# Patient Record
Sex: Male | Born: 1950 | Race: Black or African American | Hispanic: No | Marital: Married | Smoking: Former smoker
Health system: Southern US, Community
[De-identification: ages and names within clinical notes are randomized; demographics above are authoritative.]

## PROBLEM LIST (undated history)

## (undated) DIAGNOSIS — E119 Type 2 diabetes mellitus without complications: Secondary | ICD-10-CM

## (undated) DIAGNOSIS — K4090 Unilateral inguinal hernia, without obstruction or gangrene, not specified as recurrent: Secondary | ICD-10-CM

## (undated) DIAGNOSIS — K219 Gastro-esophageal reflux disease without esophagitis: Secondary | ICD-10-CM

## (undated) DIAGNOSIS — G629 Polyneuropathy, unspecified: Secondary | ICD-10-CM

## (undated) DIAGNOSIS — G4730 Sleep apnea, unspecified: Secondary | ICD-10-CM

## (undated) DIAGNOSIS — M1990 Unspecified osteoarthritis, unspecified site: Secondary | ICD-10-CM

## (undated) HISTORY — PX: SHOULDER ARTHROSCOPY: SHX128

## (undated) HISTORY — PX: SHOULDER SURGERY: SHX246

## (undated) HISTORY — PX: COLONOSCOPY: SHX174

## (undated) HISTORY — PX: EYE SURGERY: SHX253

## (undated) HISTORY — PX: KNEE ARTHROSCOPY: SUR90

---

## 2007-11-25 ENCOUNTER — Emergency Department (HOSPITAL_COMMUNITY): Admission: EM | Admit: 2007-11-25 | Discharge: 2007-11-25 | Payer: Self-pay | Admitting: Emergency Medicine

## 2015-06-24 ENCOUNTER — Other Ambulatory Visit: Payer: Self-pay | Admitting: Orthopedic Surgery

## 2015-08-02 ENCOUNTER — Encounter (HOSPITAL_COMMUNITY)
Admission: RE | Admit: 2015-08-02 | Discharge: 2015-08-02 | Disposition: A | Discharge status: Home / Self Care | Source: Ambulatory Visit | Attending: Orthopedic Surgery | Admitting: Orthopedic Surgery

## 2015-08-02 ENCOUNTER — Encounter (HOSPITAL_COMMUNITY): Payer: Self-pay

## 2015-08-02 DIAGNOSIS — G4730 Sleep apnea, unspecified: Secondary | ICD-10-CM | POA: Diagnosis not present

## 2015-08-02 DIAGNOSIS — Z01812 Encounter for preprocedural laboratory examination: Secondary | ICD-10-CM | POA: Insufficient documentation

## 2015-08-02 DIAGNOSIS — Z01818 Encounter for other preprocedural examination: Secondary | ICD-10-CM | POA: Diagnosis not present

## 2015-08-02 DIAGNOSIS — M1711 Unilateral primary osteoarthritis, right knee: Secondary | ICD-10-CM | POA: Diagnosis not present

## 2015-08-02 HISTORY — DX: Sleep apnea, unspecified: G47.30

## 2015-08-02 HISTORY — DX: Unspecified osteoarthritis, unspecified site: M19.90

## 2015-08-02 HISTORY — DX: Gastro-esophageal reflux disease without esophagitis: K21.9

## 2015-08-02 LAB — SURGICAL PCR SCREEN
MRSA, PCR: NEGATIVE
Staphylococcus aureus: NEGATIVE

## 2015-08-02 LAB — COMPREHENSIVE METABOLIC PANEL
ALT: 20 U/L (ref 17–63)
AST: 19 U/L (ref 15–41)
Albumin: 4 g/dL (ref 3.5–5.0)
Alkaline Phosphatase: 63 U/L (ref 38–126)
Anion gap: 9 (ref 5–15)
BUN: 10 mg/dL (ref 6–20)
CO2: 24 mmol/L (ref 22–32)
Calcium: 9.7 mg/dL (ref 8.9–10.3)
Chloride: 105 mmol/L (ref 101–111)
Creatinine, Ser: 0.85 mg/dL (ref 0.61–1.24)
GFR calc Af Amer: >60 mL/min (ref >60)
GFR calc non Af Amer: >60 mL/min (ref >60)
Glucose, Bld: 105 mg/dL — ABNORMAL HIGH (ref 65–99)
Potassium: 4.2 mmol/L (ref 3.5–5.1)
Sodium: 138 mmol/L (ref 135–145)
Total Bilirubin: 0.5 mg/dL (ref 0.3–1.2)
Total Protein: 8.3 g/dL — ABNORMAL HIGH (ref 6.5–8.1)

## 2015-08-02 LAB — PROTIME-INR
INR: 1.16 (ref 0.00–1.49)
Prothrombin Time: 15 seconds (ref 11.6–15.2)

## 2015-08-02 LAB — CBC WITH DIFFERENTIAL/PLATELET
Basophils Absolute: 0 10*3/uL (ref 0.0–0.1)
Basophils Relative: 0 %
Eosinophils Absolute: 0.1 10*3/uL (ref 0.0–0.7)
Eosinophils Relative: 2 %
HCT: 46.3 % (ref 39.0–52.0)
Hemoglobin: 15.1 g/dL (ref 13.0–17.0)
Lymphocytes Relative: 39 %
Lymphs Abs: 2.5 10*3/uL (ref 0.7–4.0)
MCH: 27.3 pg (ref 26.0–34.0)
MCHC: 32.6 g/dL (ref 30.0–36.0)
MCV: 83.7 fL (ref 78.0–100.0)
Monocytes Absolute: 0.5 10*3/uL (ref 0.1–1.0)
Monocytes Relative: 7 %
Neutro Abs: 3.2 10*3/uL (ref 1.7–7.7)
Neutrophils Relative %: 52 %
Platelets: 192 10*3/uL (ref 150–400)
RBC: 5.53 MIL/uL (ref 4.22–5.81)
RDW: 14 % (ref 11.5–15.5)
WBC: 6.4 10*3/uL (ref 4.0–10.5)

## 2015-08-02 LAB — URINALYSIS, ROUTINE W REFLEX MICROSCOPIC
Bilirubin Urine: NEGATIVE
Glucose, UA: NEGATIVE mg/dL
Hgb urine dipstick: NEGATIVE
Ketones, ur: NEGATIVE mg/dL
Leukocytes, UA: NEGATIVE
Nitrite: NEGATIVE
Protein, ur: NEGATIVE mg/dL
Specific Gravity, Urine: 1.018 (ref 1.005–1.030)
Urobilinogen, UA: 0.2 mg/dL (ref 0.0–1.0)
pH: 5 (ref 5.0–8.0)

## 2015-08-02 LAB — APTT: aPTT: 31 seconds (ref 24–37)

## 2015-08-02 NOTE — Pre-Procedure Instructions (Signed)
Anthony Nash  08/02/2015     Your procedure is scheduled on : Monday August 12, 2015 at 7:30 AM.  Report to [facility] Admitting at 5:30 A.M.  Call this number if you have problems the morning of surgery: [phone]    Remember:  Do not eat food or drink liquids after midnight.  Take these medicines the morning of surgery with A SIP OF WATER : Acetaminophen (Tylenol) if needed, Omeprazole (Prilosec)   Stop taking any vitamins, herbal medications, Advil, Motrin, Ibuprofen, BC Powder, etc on Monday October 10th   Please bring CPAP mask DOS   Do not wear jewelry.  Do not wear lotions, powders, or cologne.    Men may shave face and neck.  Do not bring valuables to the hospital.  [facility] is not responsible for any belongings or valuables.  Contacts, dentures or bridgework may not be worn into surgery.  Leave your suitcase in the car.  After surgery it may be brought to your room.  For patients admitted to the hospital, discharge time will be determined by your treatment team.  Patients discharged the day of surgery will not be allowed to drive home.   Name and phone number of your driver:    Special instructions:  Shower using CHG soap the night before and the morning of your surgery  Please read over the following fact sheets that you were given. Pain Booklet, Coughing and Deep Breathing, Total Joint Packet, MRSA Information and Surgical Site Infection Prevention

## 2015-08-04 LAB — URINE CULTURE

## 2015-08-09 MED ORDER — BUPIVACAINE LIPOSOME 1.3 % IJ SUSP
20.0000 mL | Freq: Once | INTRAMUSCULAR | Status: AC
  Administered 2015-08-12: 20 mL
  Filled 2015-08-09: qty 20

## 2015-08-09 MED ORDER — SODIUM CHLORIDE 0.9 % IV SOLN
1000.0000 mg | INTRAVENOUS | Status: AC
  Administered 2015-08-12: 1000 mg via INTRAVENOUS
  Filled 2015-08-09: qty 10

## 2015-08-11 MED ORDER — SODIUM CHLORIDE 0.9 % IV SOLN: INTRAVENOUS | Status: DC

## 2015-08-11 MED ORDER — CEFAZOLIN SODIUM-DEXTROSE 2-3 GM-% IV SOLR
2.0000 g | INTRAVENOUS | Status: AC
  Administered 2015-08-12: 2 g via INTRAVENOUS
  Filled 2015-08-11: qty 50

## 2015-08-12 ENCOUNTER — Inpatient Hospital Stay (HOSPITAL_COMMUNITY)
Admission: RE | Admit: 2015-08-12 | Discharge: 2015-08-13 | DRG: 470 | Disposition: A | Discharge status: Home Health | Source: Ambulatory Visit | Attending: Orthopedic Surgery | Admitting: Orthopedic Surgery

## 2015-08-12 ENCOUNTER — Encounter (HOSPITAL_COMMUNITY): Payer: Self-pay | Admitting: Surgery

## 2015-08-12 ENCOUNTER — Inpatient Hospital Stay (HOSPITAL_COMMUNITY): Admitting: Certified Registered Nurse Anesthetist

## 2015-08-12 ENCOUNTER — Encounter (HOSPITAL_COMMUNITY)
Admission: RE | Disposition: A | Discharge status: Home Health | Payer: Self-pay | Source: Ambulatory Visit | Attending: Orthopedic Surgery

## 2015-08-12 DIAGNOSIS — G4730 Sleep apnea, unspecified: Secondary | ICD-10-CM | POA: Diagnosis present

## 2015-08-12 DIAGNOSIS — Z87891 Personal history of nicotine dependence: Secondary | ICD-10-CM

## 2015-08-12 DIAGNOSIS — D62 Acute posthemorrhagic anemia: Secondary | ICD-10-CM | POA: Diagnosis not present

## 2015-08-12 DIAGNOSIS — M1711 Unilateral primary osteoarthritis, right knee: Principal | ICD-10-CM | POA: Diagnosis present

## 2015-08-12 DIAGNOSIS — K219 Gastro-esophageal reflux disease without esophagitis: Secondary | ICD-10-CM | POA: Diagnosis present

## 2015-08-12 DIAGNOSIS — M25561 Pain in right knee: Secondary | ICD-10-CM | POA: Diagnosis present

## 2015-08-12 DIAGNOSIS — Z96659 Presence of unspecified artificial knee joint: Secondary | ICD-10-CM

## 2015-08-12 HISTORY — PX: TOTAL KNEE ARTHROPLASTY: SHX125

## 2015-08-12 LAB — CBC
HCT: 43.2 % (ref 39.0–52.0)
Hemoglobin: 14 g/dL (ref 13.0–17.0)
MCH: 27.2 pg (ref 26.0–34.0)
MCHC: 32.4 g/dL (ref 30.0–36.0)
MCV: 84 fL (ref 78.0–100.0)
Platelets: 178 10*3/uL (ref 150–400)
RBC: 5.14 MIL/uL (ref 4.22–5.81)
RDW: 14.1 % (ref 11.5–15.5)
WBC: 7.7 10*3/uL (ref 4.0–10.5)

## 2015-08-12 LAB — CREATININE, SERUM
Creatinine, Ser: 0.99 mg/dL (ref 0.61–1.24)
GFR calc Af Amer: >60 mL/min (ref >60)
GFR calc non Af Amer: >60 mL/min (ref >60)

## 2015-08-12 SURGERY — ARTHROPLASTY, KNEE, TOTAL
Anesthesia: Monitor Anesthesia Care | Site: Knee | Laterality: Right

## 2015-08-12 MED ORDER — METOCLOPRAMIDE HCL 5 MG/ML IJ SOLN: 5.0000 mg | Freq: Three times a day (TID) | INTRAMUSCULAR | Status: DC | PRN

## 2015-08-12 MED ORDER — DIPHENHYDRAMINE HCL 12.5 MG/5ML PO ELIX
12.5000 mg | ORAL_SOLUTION | ORAL | Status: DC | PRN
  Administered 2015-08-13: 25 mg via ORAL
  Filled 2015-08-12: qty 10

## 2015-08-12 MED ORDER — FENTANYL CITRATE (PF) 100 MCG/2ML IJ SOLN
INTRAMUSCULAR | Status: DC | PRN
  Administered 2015-08-12: 100 ug via INTRAVENOUS
  Administered 2015-08-12 (×3): 50 ug via INTRAVENOUS

## 2015-08-12 MED ORDER — BISACODYL 5 MG PO TBEC: 5.0000 mg | DELAYED_RELEASE_TABLET | Freq: Every day | ORAL | Status: DC | PRN

## 2015-08-12 MED ORDER — BUPIVACAINE-EPINEPHRINE (PF) 0.5% -1:200000 IJ SOLN
INTRAMUSCULAR | Status: AC
  Filled 2015-08-12: qty 30

## 2015-08-12 MED ORDER — SODIUM CHLORIDE 0.9 % IR SOLN
Status: DC | PRN
  Administered 2015-08-12: 1000 mL

## 2015-08-12 MED ORDER — METHOCARBAMOL 1000 MG/10ML IJ SOLN
500.0000 mg | Freq: Four times a day (QID) | INTRAVENOUS | Status: DC | PRN
  Administered 2015-08-12: 500 mg via INTRAVENOUS
  Filled 2015-08-12 (×2): qty 5

## 2015-08-12 MED ORDER — CELECOXIB 200 MG PO CAPS
200.0000 mg | ORAL_CAPSULE | Freq: Two times a day (BID) | ORAL | Status: DC
  Administered 2015-08-12 – 2015-08-13 (×3): 200 mg via ORAL
  Filled 2015-08-12 (×3): qty 1

## 2015-08-12 MED ORDER — HYDROMORPHONE HCL 1 MG/ML IJ SOLN
INTRAMUSCULAR | Status: AC
  Filled 2015-08-12: qty 1

## 2015-08-12 MED ORDER — SODIUM CHLORIDE 0.9 % IV SOLN
INTRAVENOUS | Status: DC
  Administered 2015-08-12: 21:00:00 via INTRAVENOUS

## 2015-08-12 MED ORDER — DOCUSATE SODIUM 100 MG PO CAPS
100.0000 mg | ORAL_CAPSULE | Freq: Two times a day (BID) | ORAL | Status: DC
  Administered 2015-08-12 – 2015-08-13 (×3): 100 mg via ORAL
  Filled 2015-08-12 (×3): qty 1

## 2015-08-12 MED ORDER — MIDAZOLAM HCL 5 MG/5ML IJ SOLN
INTRAMUSCULAR | Status: DC | PRN
  Administered 2015-08-12 (×2): 1 mg via INTRAVENOUS

## 2015-08-12 MED ORDER — LIDOCAINE HCL (CARDIAC) 20 MG/ML IV SOLN
INTRAVENOUS | Status: DC | PRN
  Administered 2015-08-12: 80 mg via INTRAVENOUS

## 2015-08-12 MED ORDER — ENOXAPARIN SODIUM 30 MG/0.3ML ~~LOC~~ SOLN
30.0000 mg | Freq: Two times a day (BID) | SUBCUTANEOUS | Status: DC
  Administered 2015-08-13: 30 mg via SUBCUTANEOUS
  Filled 2015-08-12: qty 0.3

## 2015-08-12 MED ORDER — ZOLPIDEM TARTRATE 5 MG PO TABS: 5.0000 mg | ORAL_TABLET | Freq: Every evening | ORAL | Status: DC | PRN

## 2015-08-12 MED ORDER — DEXAMETHASONE SODIUM PHOSPHATE 4 MG/ML IJ SOLN
INTRAMUSCULAR | Status: AC
  Filled 2015-08-12: qty 1

## 2015-08-12 MED ORDER — SUCCINYLCHOLINE CHLORIDE 20 MG/ML IJ SOLN
INTRAMUSCULAR | Status: AC
  Filled 2015-08-12: qty 1

## 2015-08-12 MED ORDER — ACETAMINOPHEN 325 MG PO TABS: 650.0000 mg | ORAL_TABLET | Freq: Four times a day (QID) | ORAL | Status: DC | PRN

## 2015-08-12 MED ORDER — ONDANSETRON HCL 4 MG/2ML IJ SOLN: 4.0000 mg | Freq: Four times a day (QID) | INTRAMUSCULAR | Status: DC | PRN

## 2015-08-12 MED ORDER — BUPIVACAINE-EPINEPHRINE 0.5% -1:200000 IJ SOLN
INTRAMUSCULAR | Status: DC | PRN
  Administered 2015-08-12: 60 mL

## 2015-08-12 MED ORDER — PROPOFOL 10 MG/ML IV BOLUS
INTRAVENOUS | Status: DC | PRN
  Administered 2015-08-12: 150 mg via INTRAVENOUS

## 2015-08-12 MED ORDER — PHENOL 1.4 % MT LIQD: 1.0000 | OROMUCOSAL | Status: DC | PRN

## 2015-08-12 MED ORDER — LACTATED RINGERS IV SOLN
INTRAVENOUS | Status: DC | PRN
  Administered 2015-08-12 (×2): via INTRAVENOUS

## 2015-08-12 MED ORDER — OXYCODONE HCL 5 MG PO TABS
5.0000 mg | ORAL_TABLET | ORAL | Status: DC | PRN
  Administered 2015-08-12 – 2015-08-13 (×2): 10 mg via ORAL
  Administered 2015-08-13: 5 mg via ORAL
  Administered 2015-08-13 (×2): 10 mg via ORAL
  Filled 2015-08-12 (×2): qty 2
  Filled 2015-08-12: qty 1
  Filled 2015-08-12 (×2): qty 2

## 2015-08-12 MED ORDER — METOCLOPRAMIDE HCL 5 MG PO TABS: 5.0000 mg | ORAL_TABLET | Freq: Three times a day (TID) | ORAL | Status: DC | PRN

## 2015-08-12 MED ORDER — ONDANSETRON HCL 4 MG/2ML IJ SOLN
INTRAMUSCULAR | Status: DC | PRN
  Administered 2015-08-12: 4 mg via INTRAVENOUS

## 2015-08-12 MED ORDER — ALUM & MAG HYDROXIDE-SIMETH 200-200-20 MG/5ML PO SUSP: 30.0000 mL | ORAL | Status: DC | PRN

## 2015-08-12 MED ORDER — DEXAMETHASONE SODIUM PHOSPHATE 4 MG/ML IJ SOLN
INTRAMUSCULAR | Status: DC | PRN
  Administered 2015-08-12: 4 mg via INTRAVENOUS

## 2015-08-12 MED ORDER — MIDAZOLAM HCL 2 MG/2ML IJ SOLN
INTRAMUSCULAR | Status: AC
  Filled 2015-08-12: qty 4

## 2015-08-12 MED ORDER — CHLORHEXIDINE GLUCONATE 4 % EX LIQD: 60.0000 mL | Freq: Once | CUTANEOUS | Status: DC

## 2015-08-12 MED ORDER — ONDANSETRON HCL 4 MG PO TABS
4.0000 mg | ORAL_TABLET | Freq: Four times a day (QID) | ORAL | Status: DC | PRN
  Administered 2015-08-13: 4 mg via ORAL
  Filled 2015-08-12: qty 1

## 2015-08-12 MED ORDER — ACETAMINOPHEN 650 MG RE SUPP: 650.0000 mg | Freq: Four times a day (QID) | RECTAL | Status: DC | PRN

## 2015-08-12 MED ORDER — PANTOPRAZOLE SODIUM 40 MG PO TBEC
40.0000 mg | DELAYED_RELEASE_TABLET | Freq: Every day | ORAL | Status: DC
  Administered 2015-08-12 – 2015-08-13 (×2): 40 mg via ORAL
  Filled 2015-08-12 (×2): qty 1

## 2015-08-12 MED ORDER — METHOCARBAMOL 500 MG PO TABS
500.0000 mg | ORAL_TABLET | Freq: Four times a day (QID) | ORAL | Status: DC | PRN
  Administered 2015-08-12: 500 mg via ORAL
  Filled 2015-08-12 (×4): qty 1

## 2015-08-12 MED ORDER — HYDROMORPHONE HCL 1 MG/ML IJ SOLN
1.0000 mg | INTRAMUSCULAR | Status: DC | PRN
Start: 2015-08-12 — End: 2015-08-13
  Administered 2015-08-12 (×2): 1 mg via INTRAVENOUS
  Filled 2015-08-12 (×2): qty 1

## 2015-08-12 MED ORDER — BUPIVACAINE-EPINEPHRINE (PF) 0.25% -1:200000 IJ SOLN
INTRAMUSCULAR | Status: AC
  Filled 2015-08-12: qty 30

## 2015-08-12 MED ORDER — OXYCODONE HCL ER 10 MG PO T12A
10.0000 mg | EXTENDED_RELEASE_TABLET | Freq: Two times a day (BID) | ORAL | Status: DC
  Administered 2015-08-12 – 2015-08-13 (×3): 10 mg via ORAL
  Filled 2015-08-12 (×3): qty 1

## 2015-08-12 MED ORDER — HYDROMORPHONE HCL 1 MG/ML IJ SOLN
0.2500 mg | INTRAMUSCULAR | Status: DC | PRN
  Administered 2015-08-12: 0.5 mg via INTRAVENOUS

## 2015-08-12 MED ORDER — CEFAZOLIN SODIUM-DEXTROSE 2-3 GM-% IV SOLR
2.0000 g | Freq: Four times a day (QID) | INTRAVENOUS | Status: AC
  Administered 2015-08-12 (×2): 2 g via INTRAVENOUS
  Filled 2015-08-12 (×2): qty 50

## 2015-08-12 MED ORDER — PROPOFOL 10 MG/ML IV BOLUS
INTRAVENOUS | Status: AC
  Filled 2015-08-12: qty 20

## 2015-08-12 MED ORDER — SENNOSIDES-DOCUSATE SODIUM 8.6-50 MG PO TABS: 1.0000 | ORAL_TABLET | Freq: Every evening | ORAL | Status: DC | PRN

## 2015-08-12 MED ORDER — MENTHOL 3 MG MT LOZG: 1.0000 | LOZENGE | OROMUCOSAL | Status: DC | PRN

## 2015-08-12 MED ORDER — FLEET ENEMA 7-19 GM/118ML RE ENEM: 1.0000 | ENEMA | Freq: Once | RECTAL | Status: DC | PRN

## 2015-08-12 MED ORDER — FENTANYL CITRATE (PF) 250 MCG/5ML IJ SOLN
INTRAMUSCULAR | Status: AC
  Filled 2015-08-12: qty 5

## 2015-08-12 SURGICAL SUPPLY — 56 items
BANDAGE ESMARK 6X9 LF (GAUZE/BANDAGES/DRESSINGS) ×1 IMPLANT
BLADE SAGITTAL 13X1.27X60 (BLADE) ×2 IMPLANT
BLADE SAW SGTL 83.5X18.5 (BLADE) ×2 IMPLANT
BLADE SURG 10 STRL SS (BLADE) ×2 IMPLANT
BNDG ESMARK 6X9 LF (GAUZE/BANDAGES/DRESSINGS) ×2
BOWL SMART MIX CTS (DISPOSABLE) ×4 IMPLANT
CAPT KNEE TOTAL 3 ×2 IMPLANT
CEMENT BONE SIMPLEX SPEEDSET (Cement) ×4 IMPLANT
COVER SURGICAL LIGHT HANDLE (MISCELLANEOUS) ×2 IMPLANT
CUFF TOURNIQUET SINGLE 34IN LL (TOURNIQUET CUFF) ×2 IMPLANT
DRAPE EXTREMITY T 121X128X90 (DRAPE) ×2 IMPLANT
DRAPE INCISE IOBAN 66X45 STRL (DRAPES) ×4 IMPLANT
DRAPE PROXIMA HALF (DRAPES) IMPLANT
DRAPE U-SHAPE 47X51 STRL (DRAPES) ×2 IMPLANT
DRSG ADAPTIC 3X8 NADH LF (GAUZE/BANDAGES/DRESSINGS) ×2 IMPLANT
DRSG PAD ABDOMINAL 8X10 ST (GAUZE/BANDAGES/DRESSINGS) ×2 IMPLANT
DURAPREP 26ML APPLICATOR (WOUND CARE) ×4 IMPLANT
ELECT REM PT RETURN 9FT ADLT (ELECTROSURGICAL) ×2
ELECTRODE REM PT RTRN 9FT ADLT (ELECTROSURGICAL) ×1 IMPLANT
GAUZE SPONGE 4X4 12PLY STRL (GAUZE/BANDAGES/DRESSINGS) ×2 IMPLANT
GLOVE BIOGEL M 7.0 STRL (GLOVE) IMPLANT
GLOVE BIOGEL PI IND STRL 7.5 (GLOVE) IMPLANT
GLOVE BIOGEL PI IND STRL 8.5 (GLOVE) ×2 IMPLANT
GLOVE BIOGEL PI INDICATOR 7.5 (GLOVE)
GLOVE BIOGEL PI INDICATOR 8.5 (GLOVE) ×2
GLOVE SURG ORTHO 8.0 STRL STRW (GLOVE) ×4 IMPLANT
GOWN STRL REUS W/ TWL LRG LVL3 (GOWN DISPOSABLE) ×1 IMPLANT
GOWN STRL REUS W/ TWL XL LVL3 (GOWN DISPOSABLE) ×2 IMPLANT
GOWN STRL REUS W/TWL LRG LVL3 (GOWN DISPOSABLE) ×1
GOWN STRL REUS W/TWL XL LVL3 (GOWN DISPOSABLE) ×2
HANDPIECE INTERPULSE COAX TIP (DISPOSABLE) ×1
HOOD PEEL AWAY FACE SHEILD DIS (HOOD) ×6 IMPLANT
KIT BASIN OR (CUSTOM PROCEDURE TRAY) ×2 IMPLANT
KIT ROOM TURNOVER OR (KITS) ×2 IMPLANT
KNEE CAPITATED TOTAL 3 ×1 IMPLANT
MANIFOLD NEPTUNE II (INSTRUMENTS) ×2 IMPLANT
NEEDLE 22X1 1/2 (OR ONLY) (NEEDLE) ×4 IMPLANT
NS IRRIG 1000ML POUR BTL (IV SOLUTION) ×2 IMPLANT
PACK TOTAL JOINT (CUSTOM PROCEDURE TRAY) ×2 IMPLANT
PACK UNIVERSAL I (CUSTOM PROCEDURE TRAY) ×2 IMPLANT
PAD ARMBOARD 7.5X6 YLW CONV (MISCELLANEOUS) ×4 IMPLANT
PADDING CAST COTTON 6X4 STRL (CAST SUPPLIES) ×2 IMPLANT
SET HNDPC FAN SPRY TIP SCT (DISPOSABLE) ×1 IMPLANT
SPONGE GAUZE 4X4 12PLY STER LF (GAUZE/BANDAGES/DRESSINGS) ×2 IMPLANT
STAPLER VISISTAT 35W (STAPLE) ×2 IMPLANT
SUCTION FRAZIER TIP 10 FR DISP (SUCTIONS) ×2 IMPLANT
SUT BONE WAX W31G (SUTURE) ×2 IMPLANT
SUT VIC AB 0 CTB1 27 (SUTURE) ×4 IMPLANT
SUT VIC AB 1 CT1 27 (SUTURE) ×2
SUT VIC AB 1 CT1 27XBRD ANBCTR (SUTURE) ×2 IMPLANT
SUT VIC AB 2-0 CT1 27 (SUTURE) ×2
SUT VIC AB 2-0 CT1 TAPERPNT 27 (SUTURE) ×2 IMPLANT
SYR 20CC LL (SYRINGE) ×4 IMPLANT
TOWEL OR 17X24 6PK STRL BLUE (TOWEL DISPOSABLE) ×2 IMPLANT
TOWEL OR 17X26 10 PK STRL BLUE (TOWEL DISPOSABLE) ×2 IMPLANT
WATER STERILE IRR 1000ML POUR (IV SOLUTION) ×4 IMPLANT

## 2015-08-12 NOTE — Evaluation (Signed)
Physical Therapy Evaluation Patient Details Name: Anthony Nash MRN: [id number] DOB: [birth year] Today's Date: 08/12/2015   History of Present Illness  64 y.o. male s/p Rt total knee arthroplasty.  Clinical Impression  Pt is s/p Rt TKA presenting with the deficits listed below (see PT Problem List). Demonstrates good RLE strength with minimal knee instability, tolerating up to 50 feet of ambulation post op day #0. Reviewed precautions, exercising and appropriate positioning for optimal healing. Anticipate quick achievement of functional goals in hosptial. Pt will benefit from skilled PT to increase their independence and safety with mobility to allow discharge to the venue listed below.      Follow Up Recommendations Home health PT;Supervision for mobility/OOB    Equipment Recommendations  None recommended by PT    Recommendations for Other Services OT consult     Precautions / Restrictions Precautions Precautions: Knee Precaution Booklet Issued: Yes (comment) Precaution Comments: reviewed handout Restrictions Weight Bearing Restrictions: Yes RLE Weight Bearing: Weight bearing as tolerated      Mobility  Bed Mobility Overal bed mobility: Needs Assistance Bed Mobility: Supine to Sit     Supine to sit: Supervision     General bed mobility comments: Supervision for safety. VC for technique. Good strength through RLE to control with transition.  Transfers Overall transfer level: Needs assistance Equipment used: Rolling walker (2 wheeled) Transfers: Sit to/from Stand Sit to Stand: Min assist         General transfer comment: Min assist for boost to stand. VC for hand placement. Minimal weight bearing through RLE to rise.  Ambulation/Gait Ambulation/Gait assistance: Min guard Ambulation Distance (Feet): 50 Feet Assistive device: Rolling walker (2 wheeled) Gait Pattern/deviations: Step-to pattern;Step-through pattern;Decreased step length - left;Decreased stance time  - right;Antalgic Gait velocity: decreased Gait velocity interpretation: Below normal speed for age/gender General Gait Details: Educated on safe DME use with a rolling walker. VC for sequencing and technique. Close guard for safety. Progressed to step through gait pattern. No buckling noted although mild instability in Rt knee present.  Stairs            Wheelchair Mobility    Modified Rankin (Stroke Patients Only)       Balance Overall balance assessment: Needs assistance Sitting-balance support: No upper extremity supported;Feet supported Sitting balance-Leahy Scale: Good     Standing balance support: Single extremity supported Standing balance-Leahy Scale: Poor                               Pertinent Vitals/Pain Pain Assessment: 0-10 Pain Score: 2  Pain Location: Rt knee Pain Descriptors / Indicators: Aching Pain Intervention(s): Monitored during session;Repositioned    Home Living Family/patient expects to be discharged to:: Private residence Living Arrangements: Spouse/significant other Available Help at Discharge: Family;Available 24 hours/day Type of Home: House Home Access: Stairs to enter Entrance Stairs-Rails: Psychiatric nurse of Steps: 4 Home Layout: Two level;Able to live on main level with bedroom/bathroom Home Equipment: Gilford Rile - 2 wheels;Bedside commode;Crutches      Prior Function Level of Independence: Independent with assistive device(s)         Comments: occasionally used crutches     Hand Dominance   Dominant Hand:  (both)    Extremity/Trunk Assessment   Upper Extremity Assessment: Defer to OT evaluation           Lower Extremity Assessment: RLE deficits/detail RLE Deficits / Details: decreased strength and ROM as expected post op.  Communication   Communication: No difficulties  Cognition Arousal/Alertness: Awake/alert Behavior During Therapy: WFL for tasks assessed/performed Overall  Cognitive Status: Within Functional Limits for tasks assessed                      General Comments General comments (skin integrity, edema, etc.): Reviewed knee precautions and positioning for optimal healing.    Exercises Total Joint Exercises Ankle Circles/Pumps: AROM;Both;10 reps;Seated Quad Sets: Strengthening;Both;10 reps;Supine Long Arc Quad: Strengthening;Right;10 reps;Seated Knee Flexion: AAROM;Right;10 reps;Seated      Assessment/Plan    PT Assessment Patient needs continued PT services  PT Diagnosis Difficulty walking;Abnormality of gait;Acute pain   PT Problem List Decreased strength;Decreased range of motion;Decreased activity tolerance;Decreased balance;Decreased mobility;Decreased knowledge of use of DME;Decreased knowledge of precautions;Pain  PT Treatment Interventions DME instruction;Gait training;Stair training;Functional mobility training;Therapeutic activities;Therapeutic exercise;Balance training;Neuromuscular re-education;Patient/family education;Modalities   PT Goals (Current goals can be found in the Care Plan section) Acute Rehab PT Goals Patient Stated Goal: No pain when walking PT Goal Formulation: With patient Time For Goal Achievement: 08/26/15 Potential to Achieve Goals: Good    Frequency 7X/week   Barriers to discharge        Co-evaluation               End of Session Equipment Utilized During Treatment: Gait belt Activity Tolerance: Patient tolerated treatment well Patient left: in chair;with call bell/phone within reach;with SCD's reapplied Nurse Communication: Mobility status         Time: 6378-5885 PT Time Calculation (min) (ACUTE ONLY): 38 min   Charges:   PT Evaluation $Initial PT Evaluation Tier I: 1 Procedure PT Treatments $Gait Training: 8-22 mins $Therapeutic Activity: 8-22 mins   PT G CodesEllouise Newer 08/12/2015, 3:09 PM Elayne Snare, [place]

## 2015-08-12 NOTE — Anesthesia Preprocedure Evaluation (Addendum)
Anesthesia Evaluation  Patient identified by MRN, date of birth, ID band Patient awake    Reviewed: Allergy & Precautions, H&P , NPO status , Patient's Chart, lab work & pertinent test results  Airway Mallampati: II  TM Distance: >3 FB Neck ROM: Full    Dental no notable dental hx. (+) Teeth Intact, Dental Advisory Given   Pulmonary sleep apnea and Continuous Positive Airway Pressure Ventilation , former smoker,    Pulmonary exam normal breath sounds clear to auscultation       Cardiovascular negative cardio ROS   Rhythm:Regular Rate:Normal     Neuro/Psych negative neurological ROS  negative psych ROS   GI/Hepatic Neg liver ROS, GERD  Medicated and Controlled,  Endo/Other  negative endocrine ROS  Renal/GU negative Renal ROS  negative genitourinary   Musculoskeletal  (+) Arthritis , Osteoarthritis,    Abdominal   Peds  Hematology negative hematology ROS (+)   Anesthesia Other Findings   Reproductive/Obstetrics negative OB ROS                           Anesthesia Physical Anesthesia Plan  ASA: III  Anesthesia Plan: Spinal and MAC   Post-op Pain Management:    Induction: Intravenous  Airway Management Planned: Simple Face Mask  Additional Equipment:   Intra-op Plan:   Post-operative Plan:   Informed Consent: I have reviewed the patients History and Physical, chart, labs and discussed the procedure including the risks, benefits and alternatives for the proposed anesthesia with the patient or authorized representative who has indicated his/her understanding and acceptance.   Dental advisory given  Plan Discussed with: CRNA  Anesthesia Plan Comments:        Anesthesia Quick Evaluation

## 2015-08-12 NOTE — Progress Notes (Signed)
Orthopedic Tech Progress Note Patient Details:  Anthony Nash 13-Dec-1950 [id number]  CPM Right Knee CPM Right Knee: On Right Knee Flexion (Degrees): 90 Right Knee Extension (Degrees): 0 Additional Comments: trapeze bar patient helper Viewed order from doctor's order list  Hildred Priest 08/12/2015, 10:44 AM

## 2015-08-12 NOTE — Anesthesia Procedure Notes (Signed)
Procedure Name: LMA Insertion Date/Time: 08/12/2015 7:56 AM Performed by: Rogers Blocker Pre-anesthesia Checklist: Patient identified, Timeout performed, Emergency Drugs available, Suction available and Patient being monitored Patient Re-evaluated:Patient Re-evaluated prior to inductionOxygen Delivery Method: Circle system utilized Preoxygenation: Pre-oxygenation with 100% oxygen Intubation Type: IV induction Ventilation: Mask ventilation without difficulty LMA: LMA inserted LMA Size: 5.0 Placement Confirmation: positive ETCO2,  CO2 detector and breath sounds checked- equal and bilateral Tube secured with: Tape Dental Injury: Teeth and Oropharynx as per pre-operative assessment

## 2015-08-12 NOTE — H&P (Signed)
Anthony Nash MRN:  [id number] DOB/SEX:  1951/08/22/male  CHIEF COMPLAINT:  Painful right Knee  HISTORY: Patient is a 64 y.o. male presented with a history of pain in the right knee. Onset of symptoms was gradual starting several years ago with gradually worsening course since that time. Prior procedures on the knee include arthroscopy. Patient has been treated conservatively with over-the-counter NSAIDs and activity modification. Patient currently rates pain in the knee at 9 out of 10 with activity. There is no pain at night.  PAST MEDICAL HISTORY: There are no active problems to display for this patient.  Past Medical History  Diagnosis Date  . Sleep apnea     does not wear CPAP   . GERD (gastroesophageal reflux disease)   . Arthritis    Past Surgical History  Procedure Laterality Date  . Shoulder arthroscopy Left   . Shoulder surgery Right   . Knee arthroscopy Right     X 2  . Eye surgery Bilateral     Bilateral eye duct surgery  . Colonoscopy       MEDICATIONS:   Prescriptions prior to admission  Medication Sig Dispense Refill Last Dose  . acetaminophen (TYLENOL) 325 MG tablet Take 650 mg by mouth every 6 (six) hours as needed (pain).   08/12/2015 at 0430  . omeprazole (PRILOSEC) 20 MG capsule Take 20 mg by mouth 2 (two) times daily before a meal.    08/11/2015 at Unknown time    ALLERGIES:  No Known Allergies  REVIEW OF SYSTEMS:  Pertinent items noted in HPI and remainder of comprehensive ROS otherwise negative.   FAMILY HISTORY:  History reviewed. No pertinent family history.  SOCIAL HISTORY:   Social History  Substance Use Topics  . Smoking status: Former Research scientist (life sciences)  . Smokeless tobacco: Not on file  . Alcohol Use: Yes     EXAMINATION:  Vital signs in last 24 hours: Temp:  [97.9 F (36.6 C)] 97.9 F (36.6 C) (10/17 0558) Pulse Rate:  [59] 59 (10/17 0558) Resp:  [18] 18 (10/17 0558) BP: (123)/(75) 123/75 mmHg (10/17 0558) SpO2:  [98 %] 98 % (10/17  0558)  General appearance: alert, cooperative and no distress Lungs: clear to auscultation bilaterally Heart: regular rate and rhythm, S1, S2 normal, no murmur, click, rub or gallop Abdomen: soft, non-tender; bowel sounds normal; no masses,  no organomegaly Extremities: extremities normal, atraumatic, no cyanosis or edema and Homans sign is negative, no sign of DVT Pulses: 2+ and symmetric Skin: Skin color, texture, turgor normal. No rashes or lesions Neurologic: Alert and oriented X 3, normal strength and tone. Normal symmetric reflexes. Normal coordination and gait  Musculoskeletal:  ROM 0-115, Ligaments intact,  Imaging Review Plain radiographs demonstrate severe degenerative joint disease of the right knee. The overall alignment is significant varus. The bone quality appears to be good for age and reported activity level.  Assessment/Plan: Primary osteoarthritis, right knee   The patient history, physical examination and imaging studies are consistent with advanced degenerative joint disease of the right knee. The patient has failed conservative treatment.  The clearance notes were reviewed.  After discussion with the patient it was felt that Total Knee Replacement was indicated. The procedure,  risks, and benefits of total knee arthroplasty were presented and reviewed. The risks including but not limited to aseptic loosening, infection, blood clots, vascular injury, stiffness, patella tracking problems complications among others were discussed. The patient acknowledged the explanation, agreed to proceed with the plan.  Anthony Nash 08/12/2015, 6:40 AM

## 2015-08-12 NOTE — Transfer of Care (Signed)
Immediate Anesthesia Transfer of Care Note  Patient: Anthony Nash  Procedure(s) Performed: Procedure(s): TOTAL KNEE ARTHROPLASTY (Right)  Patient Location: PACU  Anesthesia Type:General  Level of Consciousness: awake, patient cooperative and responds to stimulation  Airway & Oxygen Therapy: Patient Spontanous Breathing and Patient connected to nasal cannula oxygen  Post-op Assessment: Report given to RN, Post -op Vital signs reviewed and stable and Patient moving all extremities X 4  Post vital signs: Reviewed and stable  Last Vitals:  Filed Vitals:   08/12/15 0558  BP: 123/75  Pulse: 59  Temp: 36.6 C  Resp: 18    Complications: No apparent anesthesia complications

## 2015-08-12 NOTE — Progress Notes (Signed)
Assessed pt's need for PRN pain medication at this time. Pt could have 10mg  oxycodone IR at 2315. Pt stated that he was ok and would not need it at that time. Discussed with pt plan regarding PRN medication and if he would like me to check back with him within the hour. Pt stated he would call this RN when he felt he needed more pain medication. Ensured call bell and room phone were in reach. Nursing will continue to monitor.

## 2015-08-12 NOTE — Progress Notes (Signed)
Utilization review completed.  

## 2015-08-13 ENCOUNTER — Encounter (HOSPITAL_COMMUNITY): Payer: Self-pay | Admitting: Orthopedic Surgery

## 2015-08-13 LAB — BASIC METABOLIC PANEL
Anion gap: 7 (ref 5–15)
BUN: 12 mg/dL (ref 6–20)
CO2: 24 mmol/L (ref 22–32)
Calcium: 8.4 mg/dL — ABNORMAL LOW (ref 8.9–10.3)
Chloride: 104 mmol/L (ref 101–111)
Creatinine, Ser: 0.7 mg/dL (ref 0.61–1.24)
GFR calc Af Amer: >60 mL/min (ref >60)
GFR calc non Af Amer: >60 mL/min (ref >60)
Glucose, Bld: 118 mg/dL — ABNORMAL HIGH (ref 65–99)
Potassium: 3.9 mmol/L (ref 3.5–5.1)
Sodium: 135 mmol/L (ref 135–145)

## 2015-08-13 LAB — CBC
HCT: 36.1 % — ABNORMAL LOW (ref 39.0–52.0)
Hemoglobin: 12.1 g/dL — ABNORMAL LOW (ref 13.0–17.0)
MCH: 27.9 pg (ref 26.0–34.0)
MCHC: 33.5 g/dL (ref 30.0–36.0)
MCV: 83.2 fL (ref 78.0–100.0)
Platelets: 163 10*3/uL (ref 150–400)
RBC: 4.34 MIL/uL (ref 4.22–5.81)
RDW: 14 % (ref 11.5–15.5)
WBC: 13 10*3/uL — ABNORMAL HIGH (ref 4.0–10.5)

## 2015-08-13 MED ORDER — METHOCARBAMOL 500 MG PO TABS: 500.0000 mg | ORAL_TABLET | Freq: Four times a day (QID) | ORAL | Status: DC | PRN

## 2015-08-13 MED ORDER — ONDANSETRON HCL 4 MG PO TABS: 4.0000 mg | ORAL_TABLET | Freq: Four times a day (QID) | ORAL | Status: DC | PRN

## 2015-08-13 MED ORDER — OXYCODONE HCL ER 10 MG PO T12A: 10.0000 mg | EXTENDED_RELEASE_TABLET | Freq: Two times a day (BID) | ORAL | Status: DC

## 2015-08-13 MED ORDER — OXYCODONE HCL 5 MG PO TABS: 5.0000 mg | ORAL_TABLET | ORAL | Status: DC | PRN

## 2015-08-13 MED ORDER — ENOXAPARIN SODIUM 40 MG/0.4ML ~~LOC~~ SOLN: 40.0000 mg | SUBCUTANEOUS | Status: DC

## 2015-08-13 NOTE — Progress Notes (Signed)
Physical Therapy Treatment Patient Details Name: Anthony Nash MRN: [id number] DOB: 1951-02-20 Today's Date: 08/13/2015    History of Present Illness 64 y.o. male s/p Rt total knee arthroplasty.    PT Comments    Continues to progress very well towards functional goals. Ambulates 170 feet today with use of a rolling walker, minimal knee instability noted on Rt. Safely completed stair training. Understands exercises and positioning for optimal healing. Adequate for d/c from PT standpoint. Will progress until d/c.  Follow Up Recommendations  Home health PT;Supervision for mobility/OOB     Equipment Recommendations  None recommended by PT    Recommendations for Other Services OT consult     Precautions / Restrictions Precautions Precautions: Knee Precaution Booklet Issued: Yes (comment) Precaution Comments: reviewed handout Restrictions Weight Bearing Restrictions: Yes RLE Weight Bearing: Weight bearing as tolerated    Mobility  Bed Mobility Overal bed mobility: Needs Assistance Bed Mobility: Supine to Sit     Supine to sit: Supervision     General bed mobility comments: Instructions to use LLE to support RLE as needed. Safe without physical assist  Transfers Overall transfer level: Needs assistance Equipment used: Rolling walker (2 wheeled) Transfers: Sit to/from Stand Sit to Stand: Supervision         General transfer comment: Cues to increase WB through RLE as tolerated. VC for hand placement. No physical assist needed today.  Ambulation/Gait Ambulation/Gait assistance: Supervision Ambulation Distance (Feet): 170 Feet Assistive device: Rolling walker (2 wheeled) Gait Pattern/deviations: Step-through pattern;Decreased step length - left;Decreased stance time - right;Antalgic Gait velocity: decreased Gait velocity interpretation: Below normal speed for age/gender General Gait Details: Mildly antalgic gait pattern. VC for Rt knee extension in stance phase for  quad activation. Noted slight Rt knee instability occasionally but able to self correct with UE support on RW.   Stairs Stairs: Yes Stairs assistance: Min assist Stair Management: One rail Left;Forwards;Backwards;With walker Number of Stairs: 2 (x3) General stair comments: Practiced forward approach with 2 hands on rail, followed by posterior approach with min assist to block RW only. Performed safely without loss of balance. States he feels confident with this task.  Wheelchair Mobility    Modified Rankin (Stroke Patients Only)       Balance                                    Cognition Arousal/Alertness: Awake/alert Behavior During Therapy: WFL for tasks assessed/performed Overall Cognitive Status: Within Functional Limits for tasks assessed                      Exercises Total Joint Exercises Ankle Circles/Pumps: AROM;Both;10 reps;Seated Short Arc Quad: Strengthening;Right;10 reps;Supine Heel Slides: AROM;Strengthening;Right;10 reps;Seated Hip ABduction/ADduction: Strengthening;Right;10 reps;Seated Straight Leg Raises: Strengthening;Right;10 reps;Supine Long Arc Quad: Strengthening;Right;10 reps;Seated Knee Flexion: AAROM;Right;10 reps;Seated Goniometric ROM: Rt knee 9-93 degrees flexion    General Comments        Pertinent Vitals/Pain Pain Assessment: Faces Faces Pain Scale: Hurts little more Pain Location: Rt knee Pain Descriptors / Indicators: Aching Pain Intervention(s): Monitored during session;Repositioned;Patient requesting pain meds-RN notified    Home Living                      Prior Function            PT Goals (current goals can now be found in the care plan section) Acute Rehab PT Goals  Patient Stated Goal: No pain when walking PT Goal Formulation: With patient Time For Goal Achievement: 08/26/15 Potential to Achieve Goals: Good Progress towards PT goals: Progressing toward goals    Frequency  7X/week     PT Plan Current plan remains appropriate    Co-evaluation             End of Session Equipment Utilized During Treatment: Gait belt Activity Tolerance: Patient tolerated treatment well Patient left: in chair;with call bell/phone within reach;with SCD's reapplied     Time: 1610-9604 PT Time Calculation (min) (ACUTE ONLY): 35 min  Charges:  $Gait Training: 8-22 mins $Therapeutic Exercise: 8-22 mins                    G Codes:      Ellouise Newer 09-01-15, 10:30 AM Elayne Snare, [place]

## 2015-08-13 NOTE — Evaluation (Signed)
Occupational Therapy Evaluation Patient Details Name: Anthony Nash MRN: [id number] DOB: 1950/11/06 Today's Date: 08/13/2015    History of Present Illness 64 y.o. male s/p Rt total knee arthroplasty.   Clinical Impression   Pt reports he was independent with ADLs PTA. Currently pt is overall at a supervision level for ADLs and functional mobility with the exception of min A for LB ADLs. All education completed; pt verbalized understanding. Pt plan to d/c home with 24/7 supervision from family. Pt ready to d/c from and OT standpoint, signing off at this time. Thank you for this referral.     Follow Up Recommendations  No OT follow up;Supervision - Intermittent    Equipment Recommendations  None recommended by OT    Recommendations for Other Services       Precautions / Restrictions Precautions Precautions: Knee Precaution Booklet Issued: No Precaution Comments: reviewed handout Restrictions Weight Bearing Restrictions: Yes RLE Weight Bearing: Weight bearing as tolerated      Mobility Bed Mobility Overal bed mobility: Needs Assistance Bed Mobility: Supine to Sit;Sit to Supine     Supine to sit: Supervision Sit to supine: Supervision   General bed mobility comments: Supervision for safety  Transfers Overall transfer level: Needs assistance Equipment used: Rolling walker (2 wheeled) Transfers: Sit to/from Stand Sit to Stand: Supervision         General transfer comment: Sit <> stand from EOB x 1. Good technique and hand placement    Balance Overall balance assessment: Needs assistance         Standing balance support: Bilateral upper extremity supported Standing balance-Leahy Scale: Fair Standing balance comment: RW for support                            ADL Overall ADL's : Needs assistance/impaired                     Lower Body Dressing: Minimal assistance;Sit to/from stand Lower Body Dressing Details (indicate cue type and  reason): Min A for threading R LE into clothing Toilet Transfer: Supervision/safety;Ambulation;BSC;RW (BSC over toilet )   Toileting- Clothing Manipulation and Hygiene: Supervision/safety   Tub/ Shower Transfer: Supervision/safety   Functional mobility during ADLs: Supervision/safety;Rolling walker General ADL Comments: No family present for OT eval. Educated pt on compensatory strategies for LB ADLs, use of 3 in 1 over toilet and in tub, safety with RW, need for close supervision during ADLs and functional mobility; pt verbalized understanding. Pt reports he has been up to the bathroom on his own 2 x already; discussed supervion for safety with pt. Provided pt with handout for 3 in 1 as tub seat.       Vision     Perception     Praxis      Pertinent Vitals/Pain Pain Assessment: 0-10 Pain Score: 4  Faces Pain Scale: Hurts little more Pain Location: R knee Pain Descriptors / Indicators: Aching;Sore Pain Intervention(s): Limited activity within patient's tolerance;Monitored during session;Repositioned;Ice applied     Hand Dominance     Extremity/Trunk Assessment Upper Extremity Assessment Upper Extremity Assessment: Overall WFL for tasks assessed   Lower Extremity Assessment Lower Extremity Assessment: Defer to PT evaluation   Cervical / Trunk Assessment Cervical / Trunk Assessment: Normal   Communication Communication Communication: No difficulties   Cognition Arousal/Alertness: Awake/alert Behavior During Therapy: WFL for tasks assessed/performed Overall Cognitive Status: Within Functional Limits for tasks assessed  General Comments       Exercises Exercises: Total Joint     Shoulder Instructions      Home Living Family/patient expects to be discharged to:: Private residence Living Arrangements: Spouse/significant other;Children Available Help at Discharge: Family;Available 24 hours/day Type of Home: House Home Access: Stairs to  enter [employer] of Steps: 4 Entrance Stairs-Rails: Right;Left Home Layout: Two level Alternate Level Stairs-Number of Steps: 13 Alternate Level Stairs-Rails: Left Bathroom Shower/Tub: Teacher, early years/pre: Standard Bathroom Accessibility: Yes How Accessible: Accessible via walker Home Equipment: [place] - 2 wheels;Bedside commode;Crutches          Prior Functioning/Environment Level of Independence: Independent with assistive device(s)        Comments: occasionally used crutches    OT Diagnosis: Acute pain   OT Problem List:     OT Treatment/Interventions:      OT Goals(Current goals can be found in the care plan section) Acute Rehab OT Goals Patient Stated Goal: return to PLOF OT Goal Formulation: With patient  OT Frequency:     Barriers to D/C:            Co-evaluation              End of Session Equipment Utilized During Treatment: Rolling walker CPM Right Knee CPM Right Knee: Off  Activity Tolerance: Patient tolerated treatment well Patient left: in bed;with call bell/phone within reach (zero degree knee applied to RLE )   Time: 5300-5110 OT Time Calculation (min): 24 min Charges:  OT General Charges $OT Visit: 1 Procedure OT Evaluation $Initial OT Evaluation Tier I: 1 Procedure OT Treatments $Self Care/Home Management : 8-22 mins G-Codes:     Binnie Kand M.S., OTR/L Pager: [phone]  08/13/2015, 11:37 AM

## 2015-08-13 NOTE — Progress Notes (Signed)
SPORTS MEDICINE AND JOINT REPLACEMENT  Anthony Mulch, MD   Anthony Spry, PA-C [place], [place], [place]  [address]                             [phone]   PROGRESS NOTE  Subjective:  negative for Chest Pain  negative for Shortness of Breath  negative for Nausea/Vomiting   negative for Calf Pain  negative for Bowel Movement   Tolerating Diet: yes         Patient reports pain as 6 on 0-10 scale.    Objective: Vital signs in last 24 hours:   Patient Vitals for the past 24 hrs:  BP Temp Temp src Pulse Resp SpO2  08/13/15 0227 120/65 mmHg 98.4 F (36.9 C) Oral 63 14 95 %  08/12/15 2249 127/74 mmHg 98.5 F (36.9 C) Oral 65 14 94 %  08/12/15 1230 120/71 mmHg 97.9 F (36.6 C) - - - 99 %  08/12/15 1115 130/65 mmHg 97.5 F (36.4 C) - (!) 58 13 99 %  08/12/15 1100 119/79 mmHg - - 61 12 99 %  08/12/15 1045 (!) 113/97 mmHg - - 60 12 97 %  08/12/15 1030 112/65 mmHg - - 66 12 100 %  08/12/15 1015 124/72 mmHg - - 63 13 98 %  08/12/15 0954 121/78 mmHg 97.4 F (36.3 C) - 69 12 97 %    @flow {1959:LAST@   Intake/Output from previous day:   10/17 0701 - 10/18 0700 In: 2368.8 [P.O.:480; I.V.:1888.8] Out: 1100 [Urine:400; Drains:650]   Intake/Output this shift:       Intake/Output      10/17 0701 - 10/18 0700 10/18 0701 - 10/19 0700   P.O. 480    I.V. 1888.8    Total Intake 2368.8     Urine 400    Drains 650    Blood 50    Total Output 1100     Net +1268.8          Urine Occurrence 2 x       LABORATORY DATA:  Recent Labs  08/12/15 1308 08/13/15 0435  WBC 7.7 13.0*  HGB 14.0 12.1*  HCT 43.2 36.1*  PLT 178 163    Recent Labs  08/12/15 1308 08/13/15 0435  NA  --  135  K  --  3.9  CL  --  104  CO2  --  24  BUN  --  12  CREATININE 0.99 0.70  GLUCOSE  --  118*  CALCIUM  --  8.4*   Lab Results  Component Value Date   INR 1.16 08/02/2015    Examination:  General appearance: alert, cooperative and no distress Extremities: Homans sign is  negative, no sign of DVT  Wound Exam: clean, dry, intact   Drainage:  Scant/small amount Serosanguinous exudate  Motor Exam: EHL and FHL Intact  Sensory Exam: Deep Peroneal normal   Assessment:    1 Day Post-Op  Procedure(s) (LRB): TOTAL KNEE ARTHROPLASTY (Right)  ADDITIONAL DIAGNOSIS:  Active Problems:   S/P total knee arthroplasty  Acute Blood Loss Anemia   Plan: Physical Therapy as ordered Weight Bearing as Tolerated (WBAT)  DVT Prophylaxis:  Lovenox  DISCHARGE PLAN: Home  DISCHARGE NEEDS: HHPT, CPM, Walker and 3-in-1 comode seat         Anthony Nash 08/13/2015, 7:39 AM

## 2015-08-13 NOTE — Anesthesia Postprocedure Evaluation (Signed)
  Anesthesia Post-op Note  Patient: Anthony Nash) Performed: Procedure(s): TOTAL KNEE ARTHROPLASTY (Right)  Patient Location: PACU  Anesthesia Type:General  Level of Consciousness: awake and alert   Airway and Oxygen Therapy: Patient Spontanous Breathing  Post-op Pain: Controlled  Post-op Assessment: Post-op Vital signs reviewed, Patient's Cardiovascular Status Stable and Respiratory Function Stable  Post-op Vital Signs: Reviewed  Filed Vitals:   08/13/15 0227  BP: 120/65  Pulse: 63  Temp: 36.9 C  Resp: 14    Complications: No apparent anesthesia complications

## 2015-08-13 NOTE — Progress Notes (Signed)
This Camera operator, called to patient's room by primary RN, as patient was demanding to speak with Dr. Ronnie Derby; this RN discussed the sequence of events that had occurred throughout the day and during shift change. Patient witnessed by another NT and RN up out of the bed hopping from the bed to the chair, adamantly demanding to leave and speak with Dr. Ronnie Derby. Per patient, Hemovac drain pulled out of knee, as there was blood on the right lateral knee. This RN dressed knee at area bleeding with ABD and Kerlix.  When this RN arrived patient was putting on pants and demanding he leave AMA. This RN sat down and asked patient to explain the events that had occurred and what had angered him.  Patient explained while raising his voice and cursing that he was upset about his pain medication, as he felt like shift change took precedence over him receiving his pain medication. This RN apologized numerous times for the lack of care that he felt like he had received earlier in the day and for the pain medication misunderstanding, as the patient thought his medicine would be administered on a scheduled basis versus an as needed, PRN, basis. This RN explained to the patient that at the onset of pain, it is best to go ahead and ask for the PO PRN medication so the pain does not spiral out of control.  Patient expressed understanding of pain medication schedule and we both concluded that he would stay for the night, receive any other antibiotics needed and also receive his pain medication, as the patient just had surgery today. After speaking with primary RN, patient called at 726pm to request pain medication and Dilaudid 1mg  was administered at 742pm. Patient continues to explain events while this RN tried deescalate the situation. This RN also administered PO PRN pain medications (see MAR) while speaking with patient. Patient's son and wife arrived and this RN spoke with both family members and patient about events and all agreed and  expressed understanding of patient's care that should take place from here on out. Patient agreeable to transfer back to the bed with assistance and also to use the CPM. After calming patient, patient still demands to speak with Dr. Ronnie Derby; Dr. Ronnie Derby contacted and this RN explained the prior situation and plan to MD. Dr. Ronnie Derby spoke with patient and patient agreed to stay and receive care, as nursing staff would frequently round on patient, anticipate patient's PRN pain medication needs and provide patient with exceptional care.

## 2015-08-13 NOTE — Discharge Instructions (Signed)

## 2015-08-13 NOTE — Care Management Note (Signed)
Case Management Note  Patient Details  Name: Anthony Nash MRN: [id number] Date of Birth: October 27, 1950  Subjective/Objective:   64 yr old male s/p right total knee arthroplasty.                 Action/Plan:  Case manager spoke with patient concerning home health and DME needs at discharge. Patient was preoperatively setup with [facility], no changes. Patient has family support at discharge. Case manager faxed home health orders to [place] at the [place] per request. Faxed to [phone].   Expected Discharge Date:    08/13/15              Expected Discharge Plan:   Home with Home Health  In-House Referral:  NA  Discharge planning Services  CM Consult, Homebound not met per provider  Post Acute Care Choice:  Home Health Choice offered to:  Patient  DME Arranged:  3-N-1, CPM, Walker rolling DME Agency:  [employer]  HH Arranged:  PT HH Agency:  [facility]  Status of Service:  Completed, signed off  Medicare Important Message Given:    Date Medicare IM Given:    Medicare IM give by:    Date Additional Medicare IM Given:    Additional Medicare Important Message give by:     If discussed at [place] of Stay Meetings, dates discussed:    Additional Comments:  Ninfa Meeker, RN 08/13/2015, 11:34 AM

## 2015-08-13 NOTE — Op Note (Signed)
TOTAL KNEE REPLACEMENT OPERATIVE NOTE:  08/12/2015  1:45 PM  PATIENT:  Anthony Nash 37  64 y.o. male  PRE-OPERATIVE DIAGNOSIS:  primary osteoarthritis right knee  POST-OPERATIVE DIAGNOSIS:  primary osteoarthritis right knee  PROCEDURE:  Procedure(s): TOTAL KNEE ARTHROPLASTY  SURGEON:  Surgeon(s): Vickey Huger, MD  PHYSICIAN ASSISTANT: Carlynn Spry, [facility]  ANESTHESIA:   general  DRAINS: Hemovac  SPECIMEN: None  COUNTS:  Correct  TOURNIQUET:   Total Tourniquet Time Documented: Thigh (Right) - 8 minutes Total: Thigh (Right) - 8 minutes   DICTATION:  Indication for procedure:    The patient is a 64 y.o. male who has failed conservative treatment for primary osteoarthritis right knee.  Informed consent was obtained prior to anesthesia. The risks versus benefits of the operation were explain and in a way the patient can, and did, understand.   On the implant demand matching protocol, this patient scored 14.  Therefore, this patient was receive a polyethylene insert with vitamin E which is a high demand implant.  Description of procedure:     The patient was taken to the operating room and placed under anesthesia.  The patient was positioned in the usual fashion taking care that all body parts were adequately padded and/or protected.  I foley catheter was placed.  A tourniquet was applied and the leg prepped and draped in the usual sterile fashion.  The extremity was exsanguinated with the esmarch and tourniquet inflated to 350 mmHg.  Pre-operative range of motion was normal.  The knee was in 5 degree of mild varus.  A midline incision approximately 6-7 inches long was made with a #10 blade.  A new blade was used to make a parapatellar arthrotomy going 2-3 cm into the quadriceps tendon, over the patella, and alongside the medial aspect of the patellar tendon.  A synovectomy was then performed with the #10 blade and forceps. I then elevated the deep MCL off the medial tibial  metaphysis subperiosteally around to the semimembranosus attachment.    I everted the patella and used calipers to measure patellar thickness.  I used the reamer to ream down to appropriate thickness to recreate the native thickness.  I then removed excess bone with the rongeur and sagittal saw.  I used the appropriately sized template and drilled the three lug holes.  I then put the trial in place and measured the thickness with the calipers to ensure recreation of the native thickness.  The trial was then removed and the patella subluxed and the knee brought into flexion.  A homan retractor was place to retract and protect the patella and lateral structures.  A Z-retractor was place medially to protect the medial structures.  The extra-medullary alignment system was used to make cut the tibial articular surface perpendicular to the anamotic axis of the tibia and in 3 degrees of posterior slope.  The cut surface and alignment jig was removed.  I then used the intramedullary alignment guide to make a 6 valgus cut on the distal femur.  I then marked out the epicondylar axis on the distal femur.  The posterior condylar axis measured 5 degrees.  I then used the anterior referencing sizer and measured the femur to be a size 9.  The 4-In-1 cutting block was screwed into place in external rotation matching the posterior condylar angle, making our cuts perpendicular to the epicondylar axis.  Anterior, posterior and chamfer cuts were made with the sagittal saw.  The cutting block and cut pieces were removed.  A  lamina spreader was placed in 90 degrees of flexion.  The ACL, PCL, menisci, and posterior condylar osteophytes were removed.  A 14 mm spacer blocked was found to offer good flexion and extension gap balance after minimal in degree releasing.   The scoop retractor was then placed and the femoral finishing block was pinned in place.  The small sagittal saw was used as well as the lug drill to finish the femur.   The block and cut surfaces were removed and the medullary canal hole filled with autograft bone from the cut pieces.  The tibia was delivered forward in deep flexion and external rotation.  A size G tray was selected and pinned into place centered on the medial 1/3 of the tibial tubercle.  The reamer and keel was used to prepare the tibia through the tray.    I then trialed with the size 9 femur, size G tibia, a 14 mm insert and the 35 patella.  I had excellent flexion/extension gap balance, excellent patella tracking.  Flexion was full and beyond 120 degrees; extension was zero.  These components were chosen and the staff opened them to me on the back table while the knee was lavaged copiously and the cement mixed.  The soft tissue was infiltrated with 60cc of exparel 1.3% through a 21 gauge needle.  I cemented in the components and removed all excess cement.  The polyethylene tibial component was snapped into place and the knee placed in extension while cement was hardening.  The capsule was infilltrated with 30cc of .25% Marcaine with epinephrine.  A hemovac was place in the joint exiting superolaterally.  A pain pump was place superomedially superficial to the arthrotomy.  Once the cement was hard, the tourniquet was let down.  Hemostasis was obtained.  The arthrotomy was closed with figure-8 #1 vicryl sutures.  The deep soft tissues were closed with #0 vicryls and the subcuticular layer closed with a running #2-0 vicryl.  The skin was reapproximated and closed with skin staples.  The wound was dressed with xeroform, 4 x4's, 2 ABD sponges, a single layer of webril and a TED stocking.   The patient was then awakened, extubated, and taken to the recovery room in stable condition.  BLOOD LOSS:  300cc DRAINS: 1 hemovac, 1 pain catheter COMPLICATIONS:  None.  PLAN OF CARE: Admit to inpatient   PATIENT DISPOSITION:  PACU - hemodynamically stable.   Delay start of Pharmacological VTE agent (>24hrs)  due to surgical blood loss or risk of bleeding:  not applicable  Please fax a copy of this op note to my office at [phone] (please only include page 1 and 2 of the Case Information op note)

## 2015-08-22 NOTE — Discharge Summary (Signed)
SPORTS MEDICINE & JOINT REPLACEMENT   Anthony Mulch, MD   Anthony Spry, PA-C Anthony Nash, [place], [place]  [address]                             [phone]  PATIENT ID: Anthony Nash        MRN:  [id number]          DOB/AGE: Feb 05, 1951 / 64 y.o.    DISCHARGE SUMMARY  ADMISSION DATE:    08/12/2015 DISCHARGE DATE:   08/13/2015  ADMISSION DIAGNOSIS: primary osteoarthritis right knee    DISCHARGE DIAGNOSIS:  primary osteoarthritis right knee    ADDITIONAL DIAGNOSIS: Active Problems:   S/P total knee arthroplasty  Past Medical History  Diagnosis Date  . Sleep apnea     does not wear CPAP   . GERD (gastroesophageal reflux disease)   . Arthritis     PROCEDURE: Procedure(s): TOTAL KNEE ARTHROPLASTY on 08/12/2015  CONSULTS:     HISTORY:  See H&P in chart  HOSPITAL COURSE:  Anthony Nash is a 64 y.o. admitted on 08/12/2015 and found to have a diagnosis of primary osteoarthritis right knee.  After appropriate laboratory studies were obtained  they were taken to the operating room on 08/12/2015 and underwent Procedure(s): TOTAL KNEE ARTHROPLASTY.   They were given perioperative antibiotics:  Anti-infectives    Start     Dose/Rate Route Frequency Ordered Stop   08/12/15 1400  ceFAZolin (ANCEF) IVPB 2 g/50 mL premix     2 g 100 mL/hr over 30 Minutes Intravenous Every 6 hours 08/12/15 1153 08/12/15 2220   08/12/15 0600  ceFAZolin (ANCEF) IVPB 2 g/50 mL premix     2 g 100 mL/hr over 30 Minutes Intravenous On call to O.R. 08/11/15 1315 08/12/15 0757    .  Tolerated the procedure well.  Placed with a foley intraoperatively.  Given Ofirmev at induction and for 48 hours.    POD# 1: Vital signs were stable.  Patient denied Chest pain, shortness of breath, or calf pain.  Patient was started on Lovenox 30 mg subcutaneously twice daily at 8am.  Consults to PT, OT, and care management were made.  The patient was weight bearing as tolerated.  CPM was placed on the operative  leg 0-90 degrees for 6-8 hours a day.  Incentive spirometry was taught.  Dressing was changed.  Hemovac was discontinued.      POD #2, Continued  PT for ambulation and exercise program.  IV saline locked.  O2 discontinued.    The remainder of the hospital course was dedicated to ambulation and strengthening.   The patient was discharged on 1 day post op in  Good condition.  Blood products given:none  DIAGNOSTIC STUDIES: Recent vital signs: No data found.      Recent laboratory studies: No results for input(s): WBC, HGB, HCT, PLT in the last 168 hours. No results for input(s): NA, K, CL, CO2, BUN, CREATININE, GLUCOSE, CALCIUM in the last 168 hours. Lab Results  Component Value Date   INR 1.16 08/02/2015     Recent Radiographic Studies :  Dg Chest 2 View  08/02/2015  CLINICAL DATA:  Preop exam prior to knee replacement surgery. History of sleep apnea. EXAM: CHEST  2 VIEW COMPARISON:  None. FINDINGS: Heart, mediastinum and hila are unremarkable. The lungs are clear.  No pleural effusion or pneumothorax. Bony thorax is intact. IMPRESSION: No active cardiopulmonary disease. Electronically Signed  By: Lajean Manes M.D.   On: 08/02/2015 11:43    DISCHARGE INSTRUCTIONS: Discharge Instructions    CPM    Complete by:  As directed   Continuous passive motion machine (CPM):      Use the CPM from 0 to 90 for 6-8 hours per day.      You may increase by 10 per day.  You may break it up into 2 or 3 sessions per day.      Use CPM for 2 weeks or until you are told to stop.     Call MD / Call 911    Complete by:  As directed   If you experience chest pain or shortness of breath, CALL 911 and be transported to the hospital emergency room.  If you develope a fever above 101 F, pus (white drainage) or increased drainage or redness at the wound, or calf pain, call your surgeon's office.     Change dressing    Complete by:  As directed   Change dressing on Wednesday, then change the dressing daily  with sterile 4 x 4 inch gauze dressing and apply TED hose.     Constipation Prevention    Complete by:  As directed   Drink plenty of fluids.  Prune juice may be helpful.  You may use a stool softener, such as Colace (over the counter) 100 mg twice a day.  Use MiraLax (over the counter) for constipation as needed.     Diet - low sodium heart healthy    Complete by:  As directed      Do not put a pillow under the knee. Place it under the heel.    Complete by:  As directed      Driving restrictions    Complete by:  As directed   No driving for 6 weeks     Increase activity slowly as tolerated    Complete by:  As directed      Lifting restrictions    Complete by:  As directed   No lifting for 6 weeks     TED hose    Complete by:  As directed   Use stockings (TED hose) for 2 weeks on both leg(s).  You may remove them at night for sleeping.           DISCHARGE MEDICATIONS:     Medication List    TAKE these medications        acetaminophen 325 MG tablet  Commonly known as:  TYLENOL  Take 650 mg by mouth every 6 (six) hours as needed (pain).     enoxaparin 40 MG/0.4ML injection  Commonly known as:  LOVENOX  Inject 0.4 mLs (40 mg total) into the skin daily.     methocarbamol 500 MG tablet  Commonly known as:  ROBAXIN  Take 1-2 tablets (500-1,000 mg total) by mouth every 6 (six) hours as needed for muscle spasms.     omeprazole 20 MG capsule  Commonly known as:  PRILOSEC  Take 20 mg by mouth 2 (two) times daily before a meal.     ondansetron 4 MG tablet  Commonly known as:  ZOFRAN  Take 1 tablet (4 mg total) by mouth every 6 (six) hours as needed for nausea.     oxyCODONE 5 MG immediate release tablet  Commonly known as:  Oxy IR/ROXICODONE  Take 1-2 tablets (5-10 mg total) by mouth every 3 (three) hours as needed for breakthrough pain.  oxyCODONE 10 mg 12 hr tablet  Commonly known as:  OXYCONTIN  Take 1 tablet (10 mg total) by mouth every 12 (twelve) hours.         FOLLOW UP VISIT:       Follow-up Information    Follow up with [facility].   Why:  Someone from [facility] will contact you concerning start date and time for therapy.    Contact information:   [address] [place] [address] [phone]       Follow up with Rudean Haskell, MD. Call on 08/27/2015.   Specialty:  Orthopedic Surgery   Contact information:   [place] [address] [phone]       DISPOSITION: HOME   CONDITION:  Good   Aspen Lawrance 08/22/2015, 1:22 PM

## 2016-07-19 ENCOUNTER — Emergency Department (HOSPITAL_COMMUNITY)
Admission: EM | Admit: 2016-07-19 | Discharge: 2016-07-19 | Disposition: A | Discharge status: Home / Self Care | Attending: Emergency Medicine | Admitting: Emergency Medicine

## 2016-07-19 ENCOUNTER — Encounter (HOSPITAL_COMMUNITY): Payer: Self-pay | Admitting: Emergency Medicine

## 2016-07-19 DIAGNOSIS — Z87891 Personal history of nicotine dependence: Secondary | ICD-10-CM | POA: Diagnosis not present

## 2016-07-19 DIAGNOSIS — L03114 Cellulitis of left upper limb: Secondary | ICD-10-CM | POA: Diagnosis not present

## 2016-07-19 DIAGNOSIS — Z96651 Presence of right artificial knee joint: Secondary | ICD-10-CM | POA: Diagnosis not present

## 2016-07-19 DIAGNOSIS — W57XXXA Bitten or stung by nonvenomous insect and other nonvenomous arthropods, initial encounter: Secondary | ICD-10-CM

## 2016-07-19 MED ORDER — NAPROXEN 250 MG PO TABS: 250.0000 mg | ORAL_TABLET | Freq: Two times a day (BID) | ORAL | 0 refills | Status: DC

## 2016-07-19 MED ORDER — DOXYCYCLINE HYCLATE 100 MG PO CAPS: 100.0000 mg | ORAL_CAPSULE | Freq: Two times a day (BID) | ORAL | 0 refills | Status: DC

## 2016-07-19 NOTE — ED Notes (Signed)
When RN entered room, pt turning channels on tv searching for football game. Pt would not answer RN's questions until found game.

## 2016-07-19 NOTE — ED Notes (Signed)
Pt understood dc material. NAD noted. script given at dc

## 2016-07-19 NOTE — ED Notes (Signed)
Mariea Clonts, PA, in w/pt.

## 2016-07-19 NOTE — ED Provider Notes (Signed)
[facility] DEPT Provider Note   CSN: [id number] Arrival date & time: 07/19/16  1658  By signing my name below, I, Anthony Nash, attest that this documentation has been prepared under the direction and in the presence of Will Amber Guthridge, PA-C Electronically Signed: Soijett Nash, ED Scribe. 07/19/16. 6:53 PM.   History   Chief Complaint Chief Complaint  Patient presents with  . Insect Bite  . Cellulitis    HPI Anthony Nash is a 65 y.o. male who presents to the Emergency Department complaining of an insect bite onset yesterday. Pt notes that he was placing trash into a dumpster when he was bit by an insect to his left hand yesterday with mild swelling occurring following. It bit him on the dorsal aspect of his left hand between his thumb and index finger. Pt states that he is unsure of what bit him. He saw a very small insect, but does not think it was a spider. Pt is having associated symptoms of left hand swelling, left hand pain, decreased sensation to left hand, and redness to affected area. Pt reports that he has not tried any medications for the relief of his symptoms. Pt denies fever, numbness, myalgias, nausea, vomiting, and any other symptoms. Denies allergies to medications. Denies blood thinner use.    The history is provided by the patient. No language interpreter was used.    Past Medical History:  Diagnosis Date  . Arthritis   . GERD (gastroesophageal reflux disease)   . Sleep apnea    does not wear CPAP     Patient Active Problem List   Diagnosis Date Noted  . 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 COLONOSCOPY    . EYE SURGERY Bilateral    Bilateral eye duct surgery  . KNEE ARTHROSCOPY Right    X 2  . SHOULDER ARTHROSCOPY Left   . SHOULDER SURGERY Right   . TOTAL KNEE ARTHROPLASTY Right 08/12/2015   Procedure: TOTAL KNEE ARTHROPLASTY;  Surgeon: Vickey Huger, MD;  Location: [facility];  Service: Orthopedics;   Laterality: Right;       Home Medications    Prior to Admission medications   Medication Sig Start Date End Date Taking? Authorizing Provider  acetaminophen (TYLENOL) 325 MG tablet Take 650 mg by mouth every 6 (six) hours as needed (pain).    Historical Provider, MD  doxycycline (VIBRAMYCIN) 100 MG capsule Take 1 capsule (100 mg total) by mouth 2 (two) times daily. 07/19/16   Waynetta Pean, PA-C  enoxaparin (LOVENOX) 40 MG/0.4ML injection Inject 0.4 mLs (40 mg total) into the skin daily. 08/13/15   Carlynn Spry, PA-C  methocarbamol (ROBAXIN) 500 MG tablet Take 1-2 tablets (500-1,000 mg total) by mouth every 6 (six) hours as needed for muscle spasms. 08/13/15   Carlynn Spry, PA-C  naproxen (NAPROSYN) 250 MG tablet Take 1 tablet (250 mg total) by mouth 2 (two) times daily with a meal. As needed for pain 07/19/16   Waynetta Pean, PA-C  omeprazole (PRILOSEC) 20 MG capsule Take 20 mg by mouth 2 (two) times daily before a meal.     Historical Provider, MD  ondansetron (ZOFRAN) 4 MG tablet Take 1 tablet (4 mg total) by mouth every 6 (six) hours as needed for nausea. 08/13/15   Carlynn Spry, PA-C  oxyCODONE (OXY IR/ROXICODONE) 5 MG immediate release tablet Take 1-2 tablets (5-10 mg total) by mouth every 3 (three) hours as needed for breakthrough pain. 08/13/15  Carlynn Spry, PA-C  OxyCODONE (OXYCONTIN) 10 mg T12A 12 hr tablet Take 1 tablet (10 mg total) by mouth every 12 (twelve) hours. 08/13/15   Carlynn Spry, PA-C    Family History History reviewed. No pertinent family history.  Social History Social History  Substance Use Topics  . Smoking status: Former Research scientist (life sciences)  . Smokeless tobacco: Never Used  . Alcohol use No     Comment: "not really"     Allergies   Review of patient's allergies indicates no known allergies.   Review of Systems Review of Systems  Constitutional: Negative for fever.  Gastrointestinal: Negative for abdominal pain, nausea and vomiting.  Musculoskeletal:  Positive for arthralgias (left hand) and joint swelling (left hand). Negative for myalgias.  Skin: Positive for color change (redness to left hand), rash and wound.  Neurological: Negative for weakness and numbness.       Decreased sensation to left hand. No tingling.      Physical Exam Updated Vital Signs BP 131/88   Pulse 63   Temp 98.2 F (36.8 C) (Oral)   Resp 16   Ht 5\' 11"  (1.803 m)   Wt 93.4 kg   SpO2 98%   BMI 28.73 kg/m   Physical Exam  Constitutional: He appears well-developed and well-nourished. No distress.  HENT:  Head: Normocephalic and atraumatic.  Eyes: Right eye exhibits no discharge. Left eye exhibits no discharge.  Cardiovascular: Normal rate, regular rhythm and intact distal pulses.   Bilateral radial pulses are intact. Good capillary refill to his left distal fingertips.  Pulmonary/Chest: Effort normal. No respiratory distress.  Musculoskeletal: Normal range of motion. He exhibits edema. He exhibits no deformity.  Patient has 2 small blisterlike lesions noted to the dorsal aspect of his left hand between his thumb and index finger. There is some mild erythema and warmth to the area. He reports an old area where there is a splinter to the palmar aspect of his hand by his thumb. He has good range of motion of his hand without difficulty. He is able to make a fist. Good capillary refill. Sensation is intact. No fluctuance. No abscesses. No drainage. No streaking erythema.  Neurological: He is alert. Coordination normal.  Sensation is intact to his left distal fingertips.  Skin: Skin is warm and dry. Capillary refill takes less than 2 seconds. Rash noted. He is not diaphoretic. There is erythema. No pallor.  See musculoskeletal and attached pictures.  Psychiatric: He has a normal mood and affect. His behavior is normal.  Nursing note and vitals reviewed.         ED Treatments / Results  DIAGNOSTIC STUDIES: Oxygen Saturation is 98% on RA, nl by my  interpretation.    COORDINATION OF CARE: 6:52 PM Discussed treatment plan with pt at bedside which includes consult with attending provider, bedside US, and pt agreed to plan.   Procedures Procedures (including critical care time)   EMERGENCY DEPARTMENT US SOFT TISSUE INTERPRETATION "Study: Limited Ultrasound of the noted body part in comments below"  INDICATIONS: Soft tissue infection Multiple views of the body part are obtained with a multi-frequency linear probe  PERFORMED BY:  Myself  IMAGES ARCHIVED?: Yes  SIDE:Left  BODY PART:Upper extremity Left hand.   FINDINGS: Cellulitis present  LIMITATIONS:  Emergent Procedure  INTERPRETATION:  Cellulitis present and No cellulitis noted  COMMENT:  No evidence of abscess to his left hand on [place].    Medications Ordered in ED Medications - No data to display  Initial Impression / Assessment and Plan / ED Course  I have reviewed the triage vital signs and the nursing notes.   Clinical Course   This is a 65 y.o. male who presents to the Emergency Department complaining of an insect bite onset yesterday. Pt notes that he was placing trash into a dumpster when he was bit by an insect to his left hand yesterday with mild swelling occurring following. It bit him on the dorsal aspect of his left hand between his thumb and index finger. Pt states that he is unsure of what bit him. He saw a very small insect, but does not think it was a spider. Pt is having associated symptoms of left hand swelling, left hand pain, decreased sensation to left hand, and redness to affected area. On exam the patient is afebrile nontoxic appearing. His 2 small areas to the dorsal aspect of his left hand between his thumb and index finger. There is some mild erythema and warmth. No streaking redness. His good sensation and capillary refill. Good radial pulses. Ultrasound was obtained at bedside which shows no evidence of abscess. We'll start the patient on  doxycycline and have him follow up closely with primary care. He reports he has appointment for follow-up tomorrow with his primary care doctor. I encouraged him to keep this appointment so they can recheck his redness and swelling and ensure that his symptoms are improving. I discussed strict and specific return precautions with the patient. I advised the patient to follow-up with their primary care provider this week. I advised the patient to return to the emergency department with new or worsening symptoms or new concerns. The patient verbalized understanding and agreement with plan.    This patient was discussed with Dr. Leonette Monarch who agrees with assessment and plan.   Final Clinical Impressions(s) / ED Diagnoses   Final diagnoses:  Cellulitis of left upper extremity  Insect bite    New Prescriptions Discharge Medication List as of 07/19/2016  7:11 PM    START taking these medications   Details  doxycycline (VIBRAMYCIN) 100 MG capsule Take 1 capsule (100 mg total) by mouth 2 (two) times daily., Starting Sun 07/19/2016, Print    naproxen (NAPROSYN) 250 MG tablet Take 1 tablet (250 mg total) by mouth 2 (two) times daily with a meal. As needed for pain, Starting Sun 07/19/2016, Print        I personally performed the services described in this documentation, which was scribed in my presence. The recorded information has been reviewed and is accurate.      Waynetta Pean, PA-C 07/19/16 1932    Fatima Blank, MD 07/20/16 [phone]

## 2016-07-19 NOTE — ED Triage Notes (Signed)
Pt presents to Ed for assessment of left hand swelling.  Pt sts he was bit yesterday by "something small and brown" with two small pockets of fluid noted to the posterior aspect of the hand.  Redness, warmth, and swelling noted to hand.

## 2016-08-11 DIAGNOSIS — M5416 Radiculopathy, lumbar region: Secondary | ICD-10-CM | POA: Insufficient documentation

## 2016-10-26 DIAGNOSIS — C61 Malignant neoplasm of prostate: Secondary | ICD-10-CM

## 2016-10-26 HISTORY — DX: Malignant neoplasm of prostate: C61

## 2016-11-22 IMAGING — CR DG CHEST 2V
2 series · 2 of 2 positions shown · non-contrast
Comparison: None.

CLINICAL DATA: Preop exam prior to knee replacement surgery.
History of sleep apnea.

EXAM:
CHEST  2 VIEW

[w chest pa]
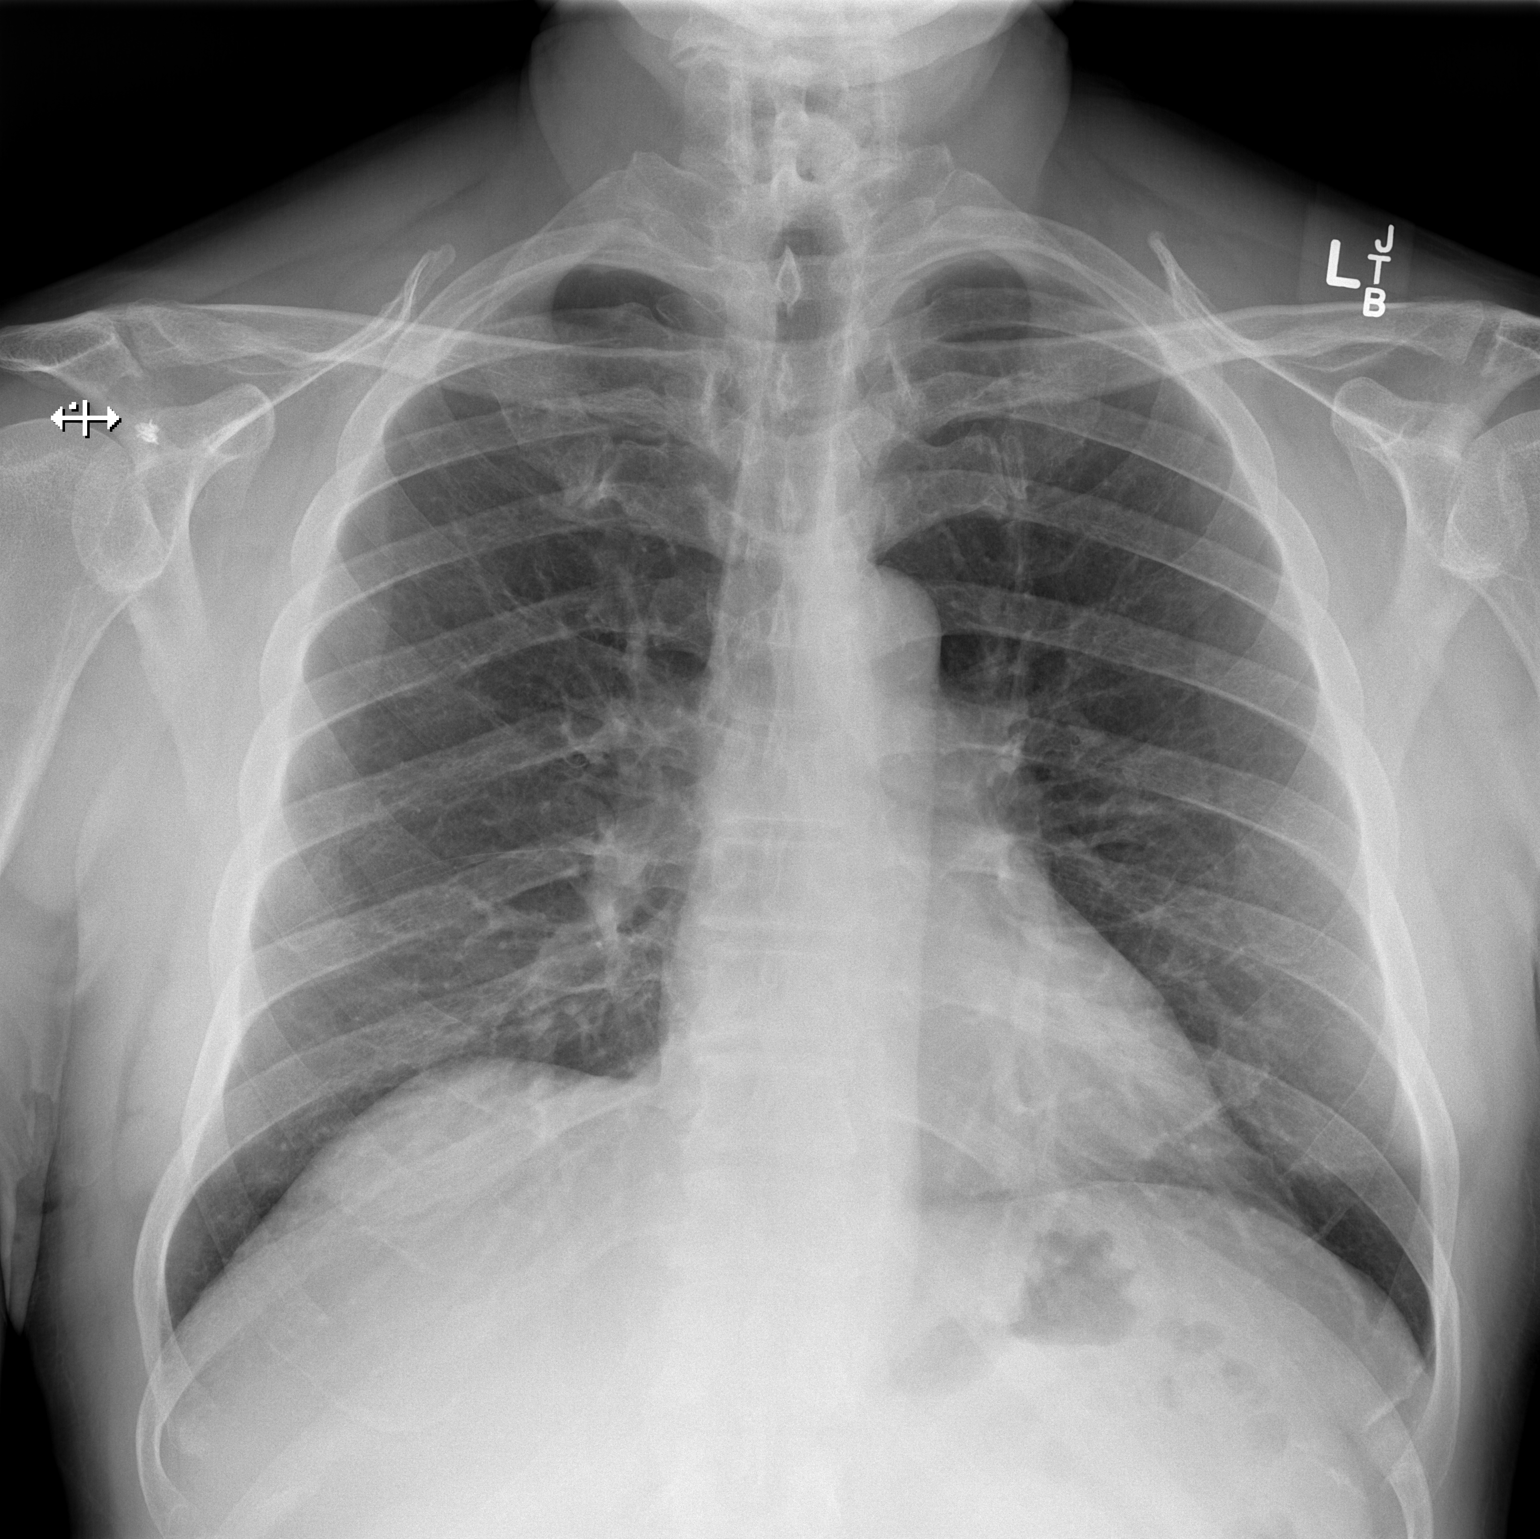

[w chest lat]
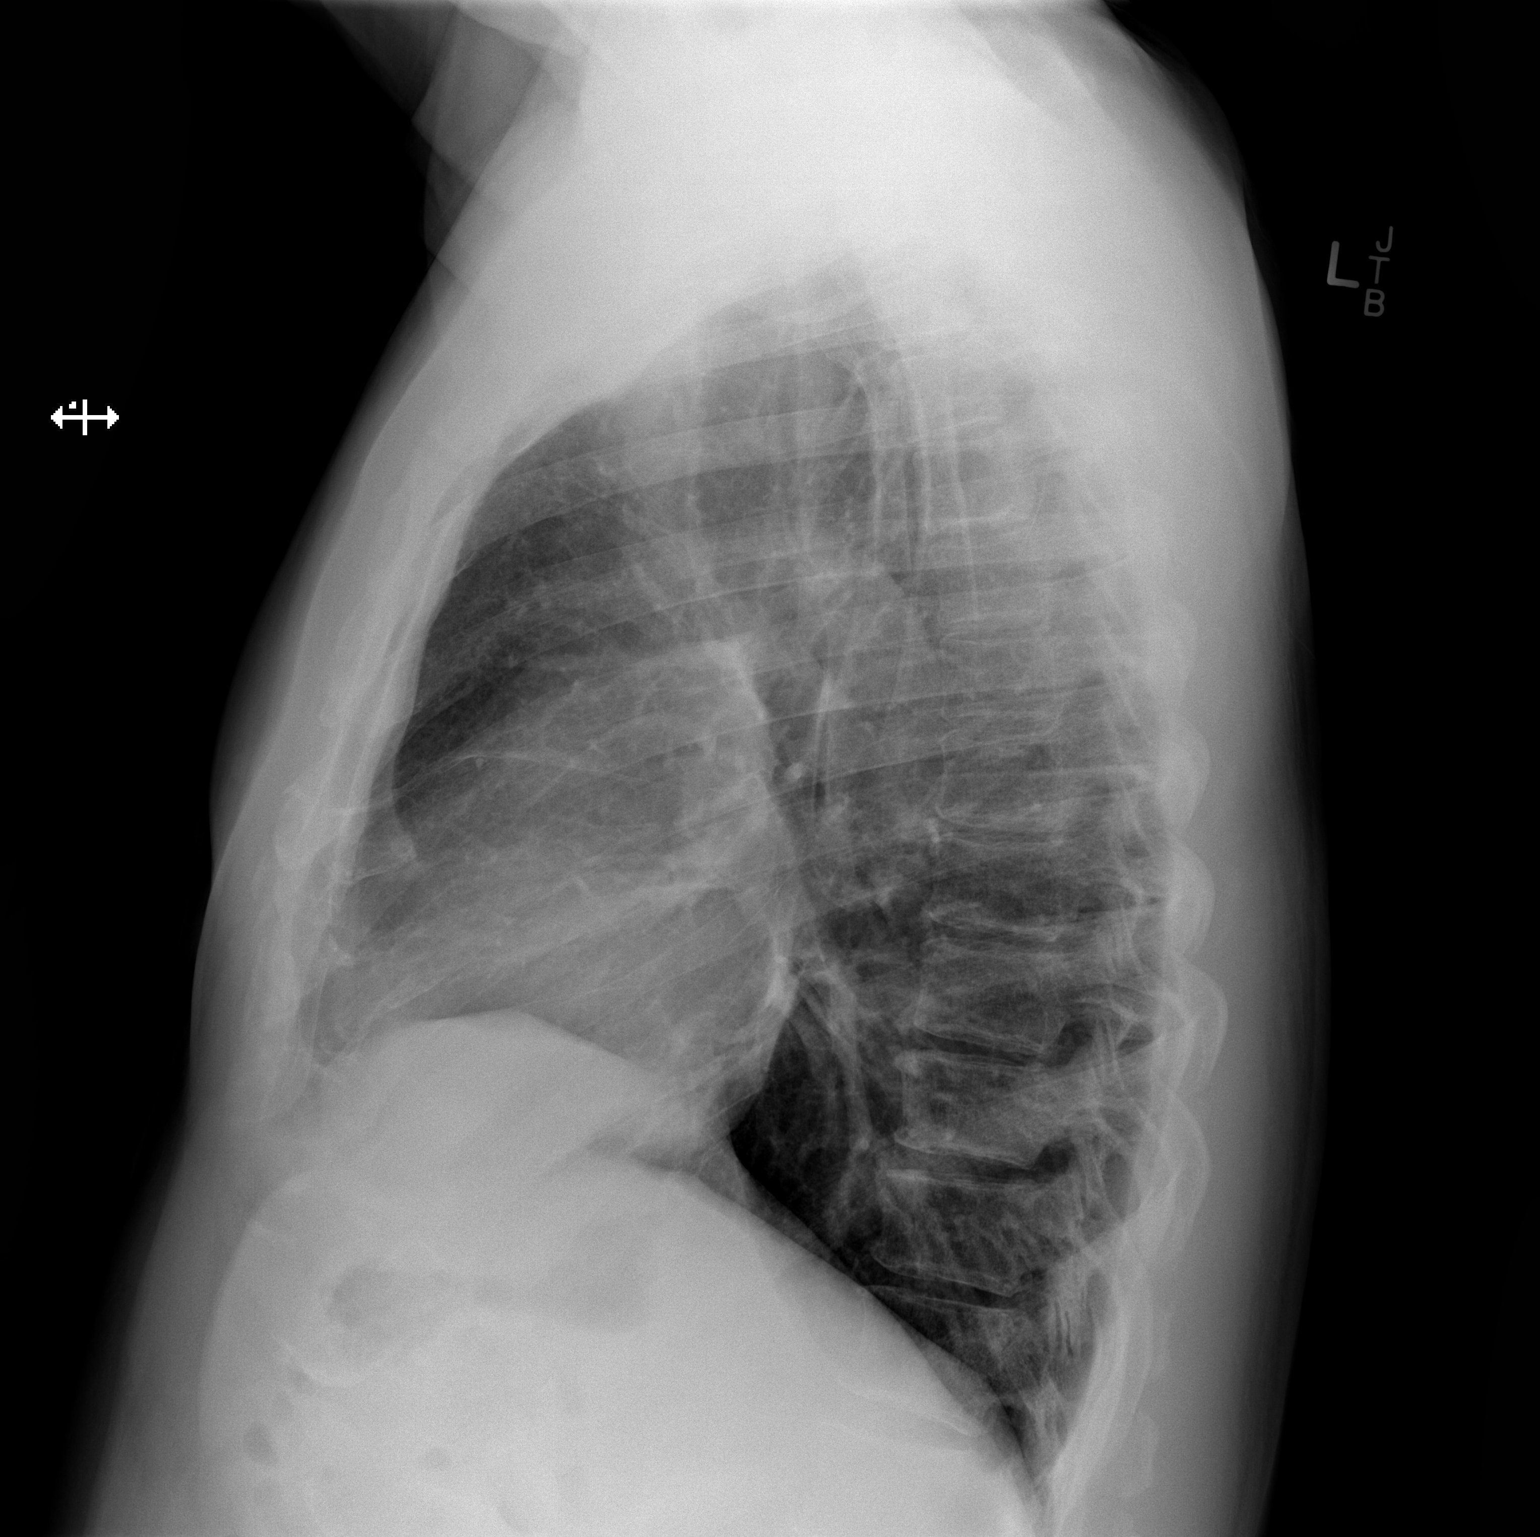

[2 of 2 positions shown; findings below may reference images not displayed]

FINDINGS: Heart, mediastinum and hila are unremarkable.

The lungs are clear.  No pleural effusion or pneumothorax.

Bony thorax is intact.
IMPRESSION: No active cardiopulmonary disease.

## 2017-02-04 DIAGNOSIS — G9589 Other specified diseases of spinal cord: Secondary | ICD-10-CM | POA: Insufficient documentation

## 2017-02-04 DIAGNOSIS — M47812 Spondylosis without myelopathy or radiculopathy, cervical region: Secondary | ICD-10-CM | POA: Insufficient documentation

## 2017-05-31 DIAGNOSIS — C61 Malignant neoplasm of prostate: Secondary | ICD-10-CM | POA: Insufficient documentation

## 2018-01-22 ENCOUNTER — Emergency Department (HOSPITAL_COMMUNITY)

## 2018-01-22 ENCOUNTER — Emergency Department (HOSPITAL_COMMUNITY)
Admission: EM | Admit: 2018-01-22 | Discharge: 2018-01-22 | Disposition: A | Discharge status: Home / Self Care | Attending: Emergency Medicine | Admitting: Emergency Medicine

## 2018-01-22 ENCOUNTER — Encounter (HOSPITAL_COMMUNITY): Payer: Self-pay | Admitting: Emergency Medicine

## 2018-01-22 DIAGNOSIS — Z87891 Personal history of nicotine dependence: Secondary | ICD-10-CM | POA: Diagnosis not present

## 2018-01-22 DIAGNOSIS — S0990XA Unspecified injury of head, initial encounter: Secondary | ICD-10-CM | POA: Diagnosis present

## 2018-01-22 DIAGNOSIS — Z7984 Long term (current) use of oral hypoglycemic drugs: Secondary | ICD-10-CM | POA: Diagnosis not present

## 2018-01-22 DIAGNOSIS — S060X0A Concussion without loss of consciousness, initial encounter: Secondary | ICD-10-CM | POA: Insufficient documentation

## 2018-01-22 DIAGNOSIS — Y999 Unspecified external cause status: Secondary | ICD-10-CM | POA: Insufficient documentation

## 2018-01-22 DIAGNOSIS — Y929 Unspecified place or not applicable: Secondary | ICD-10-CM | POA: Insufficient documentation

## 2018-01-22 DIAGNOSIS — S134XXA Sprain of ligaments of cervical spine, initial encounter: Secondary | ICD-10-CM

## 2018-01-22 DIAGNOSIS — Y9389 Activity, other specified: Secondary | ICD-10-CM | POA: Diagnosis not present

## 2018-01-22 DIAGNOSIS — W11XXXA Fall on and from ladder, initial encounter: Secondary | ICD-10-CM | POA: Diagnosis not present

## 2018-01-22 DIAGNOSIS — S161XXA Strain of muscle, fascia and tendon at neck level, initial encounter: Secondary | ICD-10-CM | POA: Insufficient documentation

## 2018-01-22 MED ORDER — NAPROXEN 500 MG PO TABS: 500.0000 mg | ORAL_TABLET | Freq: Two times a day (BID) | ORAL | 0 refills | Status: AC | PRN

## 2018-01-22 MED ORDER — METHOCARBAMOL 500 MG PO TABS: 500.0000 mg | ORAL_TABLET | Freq: Three times a day (TID) | ORAL | 0 refills | Status: AC | PRN

## 2018-01-22 NOTE — ED Provider Notes (Signed)
[facility] EMERGENC[facility] DEPARTMENT Provider Note   CSN: [id number] Arrival date & time: 01/22/18  1254     History   Chief Complaint Chief Complaint  Patient presents with  . Fall    HPI Anthony Nash is a 67 y.o. male.  HPI   67 year old male with past medical history as below here with neck pain and headache after fall.  The patient was reportedly approximately 8 feet up on a ladder.  He fell backwards, striking his head neck.  He did not lose consciousness.  He states he felt "stunned" initially after the episode, and felt like he could not move his arms or legs.  However, this resolved over less than several minutes, and he has since had no neurological deficits.  Denies any upper extremity weakness, numbness, grip weakness.  He has had no midline neck or other pain.  No chest pain or shortness of breath.  No abdominal pain.  Denies any blood thinner use.  No known history of neck disease, though he does state he has had arthritis in the past.  No loss of bowel or bladder function.  No lower extremity numbness or weakness.  Past Medical History:  Diagnosis Date  . Arthritis   . GERD (gastroesophageal reflux disease)   . Sleep apnea    does not wear CPAP     Patient Active Problem List   Diagnosis Date Noted  . 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 COLONOSCOPY    . EYE SURGERY Bilateral    Bilateral eye duct surgery  . KNEE ARTHROSCOPY Right    X 2  . SHOULDER ARTHROSCOPY Left   . SHOULDER SURGERY Right   . TOTAL KNEE ARTHROPLASTY Right 08/12/2015   Procedure: TOTAL KNEE ARTHROPLASTY;  Surgeon: Vickey Huger, MD;  Location: [facility];  Service: Orthopedics;  Laterality: Right;        Home Medications    Prior to Admission medications   Medication Sig Start Date End Date Taking? Authorizing Provider  acetaminophen (TYLENOL) 325 MG tablet Take 650 mg by mouth every 6 (six) hours as needed  (pain).   Yes [provider]  carboxymethylcellulose (REFRESH PLUS) 0.5 % SOLN Place 2 drops into both eyes 2 (two) times a week.   Yes [provider]  metFORMIN (GLUCOPHAGE) 500 MG tablet Take 500 mg by mouth daily.   Yes [provider]  omeprazole (PRILOSEC) 20 MG capsule Take 20 mg by mouth at bedtime.    Yes [provider]  doxycycline (VIBRAMYCIN) 100 MG capsule Take 1 capsule (100 mg total) by mouth 2 (two) times daily. Patient not taking: Reported on 01/22/2018 07/19/16   Waynetta Pean, PA-C  enoxaparin (LOVENOX) 40 MG/0.4ML injection Inject 0.4 mLs (40 mg total) into the skin daily. Patient not taking: Reported on 01/22/2018 08/13/15   Carlynn Spry, PA-C  methocarbamol (ROBAXIN) 500 MG tablet Take 1 tablet (500 mg total) by mouth every 8 (eight) hours as needed for up to 10 days for muscle spasms. 01/22/18 02/01/18  Duffy Bruce, MD  naproxen (NAPROSYN) 500 MG tablet Take 1 tablet (500 mg total) by mouth 2 (two) times daily as needed for up to 7 days for moderate pain. 01/22/18 01/29/18  Duffy Bruce, MD  ondansetron (ZOFRAN) 4 MG tablet Take 1 tablet (4 mg total) by mouth every 6 (six) hours as needed for nausea. Patient not taking: Reported on 01/22/2018 08/13/15   Ronnald Ramp,  Maurice, PA-C  oxyCODONE (OXY IR/ROXICODONE) 5 MG immediate release tablet Take 1-2 tablets (5-10 mg total) by mouth every 3 (three) hours as needed for breakthrough pain. Patient not taking: Reported on 01/22/2018 08/13/15   Carlynn Spry, PA-C  OxyCODONE (OXYCONTIN) 10 mg T12A 12 hr tablet Take 1 tablet (10 mg total) by mouth every 12 (twelve) hours. Patient not taking: Reported on 01/22/2018 08/13/15   Carlynn Spry, PA-C    Family History History reviewed. No pertinent family history.  Social History Social History   Tobacco Use  . Smoking status: Former Research scientist (life sciences)  . Smokeless tobacco: Never Used  Substance Use Topics  . Alcohol use: No    Comment: "not really"  . Drug  use: No     Allergies   Patient has no known allergies.   Review of Systems Review of Systems  Constitutional: Negative for chills, fatigue and fever.  HENT: Negative for congestion and rhinorrhea.   Eyes: Negative for visual disturbance.  Respiratory: Negative for cough, shortness of breath and wheezing.   Cardiovascular: Negative for chest pain and leg swelling.  Gastrointestinal: Negative for abdominal pain, diarrhea, nausea and vomiting.  Genitourinary: Negative for dysuria and flank pain.  Musculoskeletal: Positive for neck stiffness. Negative for neck pain.  Skin: Negative for rash and wound.  Allergic/Immunologic: Negative for immunocompromised state.  Neurological: Negative for syncope, weakness and headaches.  All other systems reviewed and are negative.    Physical Exam Updated Vital Signs BP 138/89 (BP Location: Right Arm)   Pulse 69   Temp 98.8 F (37.1 C) (Oral)   Resp 14   Ht 5\' 11"  (1.803 m)   Wt 93 kg (205 lb)   SpO2 98%   BMI 28.59 kg/m   Physical Exam  Constitutional: He is oriented to person, place, and time. He appears well-developed and well-nourished. No distress.  HENT:  Head: Normocephalic and atraumatic.  Eyes: Conjunctivae are normal.  Neck: Neck supple.  Cardiovascular: Normal rate, regular rhythm and normal heart sounds. Exam reveals no friction rub.  No murmur heard. Pulmonary/Chest: Effort normal and breath sounds normal. No respiratory distress. He has no wheezes. He has no rales.  Abdominal: He exhibits no distension.  Musculoskeletal: He exhibits no edema.  Neurological: He is alert and oriented to person, place, and time. He exhibits normal muscle tone.  Skin: Skin is warm. Capillary refill takes less than 2 seconds.  Psychiatric: He has a normal mood and affect.  Nursing note and vitals reviewed.   Spine: There is moderate tenderness over bilateral paracervical muscles.  No midline tenderness throughout the cervical, thoracic,  or lumbar spine. No step-offs or deformity. Strength is 5 out of 5 throughout the bilateral upper and lower extremities.  Specifically, there is 5 out of 5 strength with shoulder abduction, abduction, flexion, extension, elbow flexion and extension with rotation, as well as wrist extension and flexion.  Grip strength 5 out of 5 bilaterally. Normal sensation to light touch bilateral upper and lower extremities, including distal sensation throughout all nerve roots.   ED Treatments / Results  Labs (all labs ordered are listed, but only abnormal results are displayed) Labs Reviewed - No data to display  EKG None  Radiology Ct Head Wo Contrast  Result Date: 01/22/2018 CLINICAL DATA:  Head trauma.  Fall from ladder EXAM: CT HEAD WITHOUT CONTRAST CT CERVICAL SPINE WITHOUT CONTRAST TECHNIQUE: Multidetector CT imaging of the head and cervical spine was performed following the standard protocol without intravenous contrast. Multiplanar CT  image reconstructions of the cervical spine were also generated. COMPARISON:  None. FINDINGS: CT HEAD FINDINGS Brain: No evidence of acute infarction, hemorrhage, hydrocephalus, extra-axial collection or mass lesion/mass effect. Vascular: No hyperdense vessel or unexpected calcification. Skull: Normal. Negative for fracture or focal lesion. Sinuses/Orbits: No acute finding. Other: None. CT CERVICAL SPINE FINDINGS Alignment: Normal. Skull base and vertebrae: No acute fracture. No primary bone lesion or focal pathologic process. Soft tissues and spinal canal: No prevertebral fluid or swelling. No visible canal hematoma. Disc levels: Multi level disc space narrowing and endplate spurring is most advanced at C6-7. Upper chest: Negative. Other: Negative IMPRESSION: 1. No acute intracranial abnormalities. Normal appearance of the brain. 2. No evidence for cervical spine fracture or dislocation. 3. Multilevel degenerative disc disease. Electronically Signed   By: Kerby Moors  M.D.   On: 01/22/2018 15:00   Ct Cervical Spine Wo Contrast  Result Date: 01/22/2018 CLINICAL DATA:  Head trauma.  Fall from ladder EXAM: CT HEAD WITHOUT CONTRAST CT CERVICAL SPINE WITHOUT CONTRAST TECHNIQUE: Multidetector CT imaging of the head and cervical spine was performed following the standard protocol without intravenous contrast. Multiplanar CT image reconstructions of the cervical spine were also generated. COMPARISON:  None. FINDINGS: CT HEAD FINDINGS Brain: No evidence of acute infarction, hemorrhage, hydrocephalus, extra-axial collection or mass lesion/mass effect. Vascular: No hyperdense vessel or unexpected calcification. Skull: Normal. Negative for fracture or focal lesion. Sinuses/Orbits: No acute finding. Other: None. CT CERVICAL SPINE FINDINGS Alignment: Normal. Skull base and vertebrae: No acute fracture. No primary bone lesion or focal pathologic process. Soft tissues and spinal canal: No prevertebral fluid or swelling. No visible canal hematoma. Disc levels: Multi level disc space narrowing and endplate spurring is most advanced at C6-7. Upper chest: Negative. Other: Negative IMPRESSION: 1. No acute intracranial abnormalities. Normal appearance of the brain. 2. No evidence for cervical spine fracture or dislocation. 3. Multilevel degenerative disc disease. Electronically Signed   By: Kerby Moors M.D.   On: 01/22/2018 15:00    Procedures Procedures (including critical care time)  Medications Ordered in ED Medications - No data to display   Initial Impression / Assessment and Plan / ED Course  I have reviewed the triage vital signs and the nursing notes.  Pertinent labs & imaging results that were available during my care of the patient were reviewed by me and considered in my medical decision making (see chart for details).     67 year old male here with paracervical neck pain after fall.  On exam, the patient has paraspinal no midline tenderness.  He had a CT head and  C-spine which are negative.  He has had no upper extremity weakness, numbness, tingling, or evidence to suggest central cord or delayed cord injury or radiculopathy.  I had a very long discussion with the patient.  Given his height of fall, we discussed potential MRI to evaluate for occult cord injury.  I discussed that some cord injuries can be delayed and if untreated, can result in severe paralysis and/or death.  Patient and family expressed full understanding.  Patient has been here for some time and declines MRI at this time.  He fully understands the risks and benefits of doing so.  Given that he has no midline tenderness, no upper extremity or lower extremity weakness or numbness, or other signs of neurological injury, feel this is reasonable and will not make him sign out AMA.  However, we have had a full shared decision making discussion and the family as  well as patient will return with any new or concerning symptoms including any numbness, weakness, or other neurological symptoms.  Otherwise, will give supportive care.  Final Clinical Impressions(s) / ED Diagnoses   Final diagnoses:  Whiplash injury to neck, initial encounter  Strain of neck muscle, initial encounter  Concussion without loss of consciousness, initial encounter    ED Discharge Orders        Ordered    naproxen (NAPROSYN) 500 MG tablet  2 times daily PRN     01/22/18 1924    methocarbamol (ROBAXIN) 500 MG tablet  Every 8 hours PRN     01/22/18 1924       Duffy Bruce, MD 01/22/18 1930

## 2018-01-22 NOTE — ED Notes (Signed)
Pt verbalized understanding of discharge instructions. Pt family at bedside.

## 2018-01-22 NOTE — Discharge Instructions (Signed)
As we discussed, I suspect her symptoms are due to a muscle strain.  I prescribed medications to help with this.  As we discussed, it is critically important that you monitor your symptoms at home.  Delayed spinal cord injury can present as a numbness or tingling sensation in your hands or arms, grip or other arm weakness, or any other new neurological symptoms.  If you develop any of these present immediately to the emergency department.  For your abrasion, I recommend over-the-counter antibiotic ointment twice a day for 7-10 days.  For your concussion, the best treatment right now is rest.  Try to avoid anything that is stressful on your brain, such as screen time or reading.  Use headache, nausea, and dizziness as a marker for when you should stop and rest.  It was a pleasure meeting you and your family.  Please call or return with any questions or concerns, or should you desire the MRI.  Duffy Bruce, MD

## 2018-01-22 NOTE — ED Provider Notes (Signed)
Patient placed in Quick Look pathway, seen and evaluated   Chief Complaint:Fall  HPI:  Patient presents s/p mechanical fall from ladder, fell approximately 6-74ft. Patient states he was on the ladder- it started to slip and he fell with it. He did hit his head, no LOC. Having pain to shoulders and to the lateral aspects of neck.  Patient is not on any blood thinners.   ROS: Positive for pain to head, lateral neck, and shoulders. Negative for numbess/weakness.   Physical Exam:   Gen: No distress  Neuro: Awake and Alert  Skin: Warm    Focused Exam: Abrasion to posterior aspect of head. PERRL. No hemotympanum. No midline tenderness. Shoulders with normal ROM, no point bony tenderness.   Given height of fall with head injury CT head ordered.   Initiation of care has begun. The patient has been counseled on the process, plan, and necessity for staying for the completion/evaluation, and the remainder of the medical screening examination   Amaryllis Dyke, PA-C 01/22/18 1321    Valarie Merino, MD 01/22/18 2210

## 2018-01-22 NOTE — ED Triage Notes (Signed)
Pt to ER for evaluation of fall from a ladder while doing garage work. States it fell out from under him, he landed on his back hitting his head. Not on any anticoagulation. No midline neck tenderness. Reports bilateral shoulder pain. Denies LOC. Pt a/o x4.

## 2018-01-22 NOTE — ED Notes (Signed)
Pharmacy tech at the bedside

## 2018-05-17 DIAGNOSIS — M4802 Spinal stenosis, cervical region: Secondary | ICD-10-CM | POA: Insufficient documentation

## 2018-05-17 DIAGNOSIS — M5412 Radiculopathy, cervical region: Secondary | ICD-10-CM | POA: Insufficient documentation

## 2018-07-15 DIAGNOSIS — Z981 Arthrodesis status: Secondary | ICD-10-CM | POA: Insufficient documentation

## 2018-10-03 DIAGNOSIS — M48062 Spinal stenosis, lumbar region with neurogenic claudication: Secondary | ICD-10-CM | POA: Insufficient documentation

## 2020-05-01 DIAGNOSIS — N393 Stress incontinence (female) (male): Secondary | ICD-10-CM | POA: Insufficient documentation

## 2020-09-12 ENCOUNTER — Encounter: Payer: Self-pay | Admitting: Podiatry

## 2020-09-12 ENCOUNTER — Ambulatory Visit (INDEPENDENT_AMBULATORY_CARE_PROVIDER_SITE_OTHER): Payer: Medicare Other | Admitting: Podiatry

## 2020-09-12 DIAGNOSIS — M21612 Bunion of left foot: Secondary | ICD-10-CM

## 2020-09-12 DIAGNOSIS — M2012 Hallux valgus (acquired), left foot: Secondary | ICD-10-CM | POA: Diagnosis not present

## 2020-09-12 DIAGNOSIS — M21611 Bunion of right foot: Secondary | ICD-10-CM

## 2020-09-12 DIAGNOSIS — M2011 Hallux valgus (acquired), right foot: Secondary | ICD-10-CM

## 2020-09-12 DIAGNOSIS — L600 Ingrowing nail: Secondary | ICD-10-CM

## 2020-09-12 NOTE — Patient Instructions (Addendum)
For more info on "minimally invasive" bunion surgery go to"  [email or web address]       Soak Instructions    THE DAY AFTER THE PROCEDURE  Place 1/4 cup of epsom salts (or betadine, or white vinegar) in a quart of warm tap water.  Submerge your foot or feet with outer bandage intact for the initial soak; this will allow the bandage to become moist and wet for easy lift off.  Once you remove your bandage, continue to soak in the solution for 20 minutes.  This soak should be done twice a day.  Next, remove your foot or feet from solution, blot dry the affected area and cover.  You may use a band aid large enough to cover the area or use gauze and tape.  Apply other medications to the area as directed by the doctor such as polysporin neosporin.  IF YOUR SKIN BECOMES IRRITATED WHILE USING THESE INSTRUCTIONS, IT IS OKAY TO SWITCH TO  WHITE VINEGAR AND WATER. Or you may use antibacterial soap and water to keep the toe clean  Monitor for any signs/symptoms of infection. Call the office immediately if any occur or go directly to the emergency room. Call with any questions/concerns.

## 2020-09-15 NOTE — Progress Notes (Signed)
  Subjective:  Patient ID: Anthony Nash, male    DOB: 1951/01/20,  MRN: [id number]  Chief Complaint  Patient presents with  . Nail Problem    possible ingrown bilateral great toenails    69 y.o. male presents with the above complaint. History confirmed with patient. He also complains of bunions, the bother him some but he has noticed that the bunion surgery will be difficultwarm, good capillary refill, no trophic changes or ulcerative lesions, normal DP and PT pulses and normal sensory exam  Objective:  Physical Exam: warm, good capillary refill, bunions, no trophic changes or ulcerative lesions, normal DP and PT pulses and normal sensory exam. Left Foot: Ingrown hallux lateral border  Assessment:  No diagnosis found.   Plan:  Patient was evaluated and treated and all questions answered.    Ingrown Nail, left -Patient elects to proceed with minor surgery to remove ingrown toenail today. Consent reviewed and signed by patient. -Ingrown nail excised. See procedure note. -Educated on post-procedure care including soaking. Written instructions provided and reviewed. -Patient to follow up in 2 weeks for nail check.  Procedure: Excision of Ingrown Toenail Location: Left 1st toe lateral nail borders. Anesthesia: Lidocaine 1% plain; 1.5 mL and Marcaine 0.5% plain; 1.5 mL, digital block. Skin Prep: Betadine. Dressing: Silvadene; telfa; dry, sterile, compression dressing. Technique: Following skin prep, the toe was exsanguinated and a tourniquet was secured at the base of the toe. The affected nail border was freed, split with a nail splitter, and excised. Chemical matrixectomy was then performed with phenol and irrigated out with alcohol. The tourniquet was then removed and sterile dressing applied. Disposition: Patient tolerated procedure well. Patient to return in 2 weeks for follow-up.     We also discussed etiology and treatment options for bunions including surgical and  nonsurgical treatments.. We will discuss further if he is interested in surgery and will take x-rays at that time.  Return in about 3 weeks (around 10/03/2020) for nail re-check.

## 2020-10-03 ENCOUNTER — Ambulatory Visit: Payer: Medicare Other | Admitting: Podiatry

## 2020-10-28 ENCOUNTER — Ambulatory Visit: Payer: Self-pay | Admitting: Orthopedic Surgery

## 2020-10-28 NOTE — H&P (Signed)
Subjective:   For associated symptoms, patient reports numbness (left hand), tingling (left hand), and radiation down leg. For location, he reports bilateral (r>l). For quality, he reports aching, throbbing, and constant. For severity, he reports pain level 4-5/10 and worst pain ___/10. For duration, he reports 3 years. For timing, he reports chronic. For context, he reports cannot identify. For alleviating factors, he reports narcotics (oxy). For aggravating factors, he reports sitting (for long periods), walking (gait is off), and going from sit to stand. For previous surgery, he reports surgical procedure: (fusion 2019). For prior imaging, he reports mri (2019). For previous injections, he reports helped a little (for about a day). For previous pt, he reports did not help.  Patient Active Problem List   Diagnosis Date Noted  . 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 Spinal stenosis of lumbar region with neurogenic claudication 10/03/2018  . S/P cervical spinal fusion 07/15/2018  . Cervical radiculopathy 05/17/2018  . Cervical stenosis of spine 05/17/2018  . Malignant neoplasm of prostate ([place]) 05/31/2017  . 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 Radiculopathy, lumbar region 08/11/2016  . S/P total knee arthroplasty 08/12/2015   Past Medical History:  Diagnosis Date  . Arthritis   . GERD (gastroesophageal reflux disease)   . Sleep apnea    does not wear CPAP     Past Surgical History:  Procedure Laterality Date  . COLONOSCOPY    . EYE SURGERY Bilateral    Bilateral eye duct surgery  . KNEE ARTHROSCOPY Right    X 2  . SHOULDER ARTHROSCOPY Left   . SHOULDER SURGERY Right   . TOTAL KNEE ARTHROPLASTY Right 08/12/2015   Procedure: TOTAL KNEE ARTHROPLASTY;  Surgeon: Vickey Huger, MD;  Location: [facility];  Service: Orthopedics;  Laterality: Right;    Current Outpatient Medications  Medication Sig Dispense Refill  Last Dose  . acetaminophen (TYLENOL) 325 MG tablet Take 650 mg by mouth every 6 (six) hours as needed (pain).     . carboxymethylcellulose (REFRESH PLUS) 0.5 % SOLN Place 2 drops into both eyes 2 (two) times a week.     . doxycycline (VIBRAMYCIN) 100 MG capsule Take 1 capsule (100 mg total) by mouth 2 (two) times daily. (Patient not taking: Reported on 01/22/2018) 20 capsule 0   . enoxaparin (LOVENOX) 40 MG/0.4ML injection Inject 0.4 mLs (40 mg total) into the skin daily. (Patient not taking: Reported on 01/22/2018) 13 Syringe 0   . meloxicam (MOBIC) 7.5 MG tablet Take 1 tablet by mouth daily.     . metFORMIN (GLUCOPHAGE) 500 MG tablet Take 500 mg by mouth daily.     Marland Kitchen omeprazole (PRILOSEC) 20 MG capsule Take 20 mg by mouth at bedtime.      . ondansetron (ZOFRAN) 4 MG tablet Take 1 tablet (4 mg total) by mouth every 6 (six) hours as needed for nausea. (Patient not taking: Reported on 01/22/2018) 30 tablet 0   . oxyCODONE (OXY IR/ROXICODONE) 5 MG immediate release tablet Take 1-2 tablets (5-10 mg total) by mouth every 3 (three) hours as needed for breakthrough pain. (Patient not taking: Reported on 01/22/2018) 30 tablet 0   . OxyCODONE (OXYCONTIN) 10 mg T12A 12 hr tablet Take 1 tablet (10 mg total) by mouth every 12 (twelve) hours. (Patient not taking: Reported on 01/22/2018) 30 tablet 0   . tadalafil (CIALIS) 20 MG tablet TAKE ONE TABLET BY MOUTH AS INSTRUCTED (TAKE 1 HOUR PRIOR TO SEXUAL  ACTIVITY *DO NOT EXCEED 1 DOSE PER 24 HOUR PERIOD*)  DO NOT COMBINE WITH VIAGRA (TAKE 1 HOUR PRIOR TO SEXUAL ACTIVITY *DO NOT EXCEED 1 DOSE PER 24 HOUR PERIOD*)  DO NOT COMBINE WITH VIAGRA      No current facility-administered medications for this visit.   No Known Allergies  Social History   Tobacco Use  . Smoking status: Former Research scientist (life sciences)  . Smokeless tobacco: Never Used  Substance Use Topics  . Alcohol use: No    Comment: "not really"    No family history on file.  Review of Systems As stated in  HPI  Objective:   Clinical exam: Lauri is a pleasant individual, who appears younger than their stated age. [heshe] is alert and orientated 3. No shortness of breath, chest pain. Abdomen is soft and non-tender, history of urinary incontinence due to prostate surgery, no history of bowel incontinence, no rebound tenderness. Negative: skin lesions abrasions contusions    Peripheral pulses: 2+ dorsalis pedis/posterior tibialis pulses bilaterally. Compartment soft and nontender.    Gait pattern: Normal    Assistive devices: None    Neuro: 5/5 motor strength in the right lower extremity. 3+/5 left EHL/tibialis anterior strength, 5/5 strength in the remainder of the muscle groups tested. Negative straight leg raise test. Increased neuropathic leg pain with extension of the lumbar spine. Positive right thigh dysesthesias and pain especially with extension of back. Negative Babinski test, no clonus, 1+ deep tendon reflexes at the knee and absent at the Achilles    Musculoskeletal: Significant back pain with extension radiating into the lower extremity right side worse than the left. No significant hip or knee or ankle pain with isolated joint range of motion. No SI joint pain.    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 No spondylolisthesis or scoliosis. No fracture seen.    Lumbar MRI completed in 2019 at an outside institution demonstrates significant lumbar spinal stenosis at L4-5 with loss of normal CSF signal. Significant bone osteophyte complex from the facet joints is noted. Mild degenerative collapse of the disc space is noted. No spondylolisthesis. Moderate stenosis at L3-4.   Updated MRI of lumbar spine taken at [facility] dated 10/12/2020 both images as well as the report were personally reviewed by me as well as Dr. Rolena Infante. There continues to be significant lumbar spinal stenosis at L4-5 with loss of normal CSF signal  with some mild degenerative collapse. At L3-4 there is moderate to moderate severe central canal stenosis which does to appear to have progressed slightly.  Assessment:   Amin is a pleasant 70 year old male with significant bilateral neurogenic claudication and some lower extremity motor deficits who has failed conservative care including formalized physical therapy and several rounds of injections in the past. Updated MRI of the lumbar spine reveals severe stenosis at L4-5 as well as moderate to severe stenosis at L3-4 which are contributing to his symptoms. After discussing risks and benefits of surgical intervention the patient has elected to move forward with surgery.  Plan:   Surgical intervention would be a 2 level decompression L3-5. We did discuss with the patient that additional surgery in the future, Likely a spinal fusion, may be necessary if his slight anterior listhesis at L4-5 worsens and/or he develops worsening or significant back pain.  Risks and benefits of surgery were discussed with the patient. These include: Infection, bleeding, death, stroke, paralysis, ongoing or worse pain, need for additional surgery, leak of  spinal fluid, adjacent segment degeneration requiring additional surgery, post-operative hematoma formation that can result in neurological compromise and the need for urgent/emergent re-operation. Loss in bowel and bladder control. Injury to major vessels that could result in the need for urgent abdominal surgery to stop bleeding. Risk of deep venous thrombosis (DVT) and the need for additional treatment. Recurrent disc herniation resulting in the need for revision surgery, which could include fusion surgery (utilizing instrumentation such as pedicle screws and intervertebral cages).  Additional risk: If instrumentation is used there is a risk of migration, or breakage of that hardware that could require additional surgery.  Patient was given medical clearance form to  take to his primary care provider.  Patient was examined by Dr. Shon Baton he was in agreement with assessment and plan and also had a long discussion with the patient involving risks and benefits of surgery as well as the possibility that additional surgery may be necessary in the future.

## 2020-11-08 ENCOUNTER — Ambulatory Visit: Payer: Self-pay | Admitting: Orthopedic Surgery

## 2020-11-08 NOTE — H&P (Deleted)
  The note originally documented on this encounter has been moved the the encounter in which it belongs.  

## 2020-11-08 NOTE — H&P (Signed)
Subjective:   The patient is here today for a pre-operative History and Physical. They are scheduled for Decompression @ L3-5 on 11-14-20 with Dr. Rolena Infante at [facility].  Patient Active Problem List   Diagnosis Date Noted  . 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 Spinal stenosis of lumbar region with neurogenic claudication 10/03/2018  . S/P cervical spinal fusion 07/15/2018  . Cervical radiculopathy 05/17/2018  . Cervical stenosis of spine 05/17/2018  . Malignant neoplasm of prostate ([place]) 05/31/2017  . 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 Radiculopathy, lumbar region 08/11/2016  . S/P total knee arthroplasty 08/12/2015   Past Medical History:  Diagnosis Date  . Arthritis   . GERD (gastroesophageal reflux disease)   . Sleep apnea    does not wear CPAP     Past Surgical History:  Procedure Laterality Date  . COLONOSCOPY    . EYE SURGERY Bilateral    Bilateral eye duct surgery  . KNEE ARTHROSCOPY Right    X 2  . SHOULDER ARTHROSCOPY Left   . SHOULDER SURGERY Right   . TOTAL KNEE ARTHROPLASTY Right 08/12/2015   Procedure: TOTAL KNEE ARTHROPLASTY;  Surgeon: Vickey Huger, MD;  Location: [facility];  Service: Orthopedics;  Laterality: Right;    Current Outpatient Medications  Medication Sig Dispense Refill Last Dose  . acetaminophen (TYLENOL) 325 MG tablet Take 650 mg by mouth every 6 (six) hours as needed (pain).     . carboxymethylcellulose (REFRESH PLUS) 0.5 % SOLN Place 2 drops into both eyes 2 (two) times a week.     . doxycycline (VIBRAMYCIN) 100 MG capsule Take 1 capsule (100 mg total) by mouth 2 (two) times daily. (Patient not taking: Reported on 01/22/2018) 20 capsule 0   . enoxaparin (LOVENOX) 40 MG/0.4ML injection Inject 0.4 mLs (40 mg total) into the skin daily. (Patient not taking: Reported on 01/22/2018) 13 Syringe 0   . meloxicam (MOBIC) 7.5 MG tablet Take 1 tablet by mouth daily.     .  metFORMIN (GLUCOPHAGE) 500 MG tablet Take 500 mg by mouth daily.     Marland Kitchen omeprazole (PRILOSEC) 20 MG capsule Take 20 mg by mouth at bedtime.      . ondansetron (ZOFRAN) 4 MG tablet Take 1 tablet (4 mg total) by mouth every 6 (six) hours as needed for nausea. (Patient not taking: Reported on 01/22/2018) 30 tablet 0   . oxyCODONE (OXY IR/ROXICODONE) 5 MG immediate release tablet Take 1-2 tablets (5-10 mg total) by mouth every 3 (three) hours as needed for breakthrough pain. (Patient not taking: Reported on 01/22/2018) 30 tablet 0   . OxyCODONE (OXYCONTIN) 10 mg T12A 12 hr tablet Take 1 tablet (10 mg total) by mouth every 12 (twelve) hours. (Patient not taking: Reported on 01/22/2018) 30 tablet 0   . tadalafil (CIALIS) 20 MG tablet TAKE ONE TABLET BY MOUTH AS INSTRUCTED (TAKE 1 HOUR PRIOR TO SEXUAL ACTIVITY *DO NOT EXCEED 1 DOSE PER 24 HOUR PERIOD*)  DO NOT COMBINE WITH VIAGRA (TAKE 1 HOUR PRIOR TO SEXUAL ACTIVITY *DO NOT EXCEED 1 DOSE PER 24 HOUR PERIOD*)  DO NOT COMBINE WITH VIAGRA      No current facility-administered medications for this visit.   No Known Allergies  Social History   Tobacco Use  . Smoking status: Former Research scientist (life sciences)  . Smokeless tobacco: Never Used  Substance Use Topics  . Alcohol use: No    Comment: "not really"  No family history on file.  Review of Systems Pertinent items are noted in HPI.  Objective:   Vitals: Ht: 5 ft 11 in 11/08/2020 01:22 pm Wt: 221 lbs 11/08/2020 01:26 pm BMI: 30.8 11/08/2020 01:26 pm BP: 123/82 sitting L arm 11/08/2020 02:11 pm Pulse: 63 bpm 11/08/2020 02:11 pm  Clinical exam: Anthony Nash is a pleasant individual, who appears younger than their stated age. [heshe] is alert and orientated 3. No shortness of breath, chest pain.  Heart: Regular rate and rhythm  Lungs: Clear auscultation bilaterally  Abdomen is soft and non-tender, history of urinary incontinence due to prostate surgery, no history of bowel incontinence, no rebound tenderness. Bowel  sounds 4  Negative: skin lesions abrasions contusions Peripheral pulses: 2+ dorsalis pedis/posterior tibialis pulses bilaterally. Compartment soft and nontender. Gait pattern: Normal Assistive devices: None Neuro: 5/5 motor strength in the right lower extremity. 3+/5 left EHL/tibialis anterior strength, 5/5 strength in the remainder of the muscle groups tested. Negative straight leg raise test. Increased neuropathic leg pain with extension of the lumbar spine. Positive right thigh dysesthesias and pain especially with extension of back. Negative Babinski test, no clonus, 1+ deep tendon reflexes at the knee and absent at the Achilles Musculoskeletal: Significant back pain with extension radiating into the lower extremity right side worse than the left. No significant hip or knee or ankle pain with isolated joint range of motion. No SI joint pain. 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 No spondylolisthesis or scoliosis. No fracture seen. Lumbar MRI completed in 2019 at an outside institution demonstrates significant lumbar spinal stenosis at L4-5 with loss of normal CSF signal. Significant bone osteophyte complex from the facet joints is noted. Mild degenerative collapse of the disc space is noted. No spondylolisthesis. Moderate stenosis at L3-4.  Updated MRI of lumbar spine taken at [facility] dated 10/12/2020 both images as well as the report were personally reviewed by me as well as Dr. Rolena Infante. There continues to be significant lumbar spinal stenosis at L4-5 with loss of normal CSF signal with some mild degenerative collapse. At L3-4 there is moderate to moderate severe central canal stenosis which does to appear to have progressed slightly.  Assessment:   Anthony Nash is a pleasant 70 year old male with significant bilateral neurogenic claudication and some lower extremity motor deficits who has failed conservative care including  formalized physical therapy and several rounds of injections in the past. Updated MRI of the lumbar spine reveals severe stenosis at L4-5 as well as moderate to severe stenosis at L3-4 which are contributing to his symptoms. After discussing risks and benefits of surgical intervention the patient has elected to move forward with surgery.  Plan:   Surgical intervention would be a 2 level decompression L3-5. We did discuss with the patient that additional surgery in the future, Likely a spinal fusion, may be necessary if his slight anterior listhesis at L4-5 worsens and/or he develops worsening or significant back pain.  Risks and benefits of surgery were discussed with the patient. These include: Infection, bleeding, death, stroke, paralysis, ongoing or worse pain, need for additional surgery, leak of spinal fluid, adjacent segment degeneration requiring additional surgery, post-operative hematoma formation that can result in neurological compromise and the need for urgent/emergent re-operation. Loss in bowel and bladder control. Injury to major vessels that could result in the need for urgent abdominal surgery to stop bleeding. Risk of deep venous thrombosis (DVT) and the need for additional treatment. Recurrent disc herniation  resulting in the need for revision surgery, which could include fusion surgery (utilizing instrumentation such as pedicle screws and intervertebral cages). Additional risk: If instrumentation is used there is a risk of migration, or breakage of that hardware that could require additional surgery.  We will obtain preoperative medical clearance.  Patient has been fitted for LSO brace.  I reviewed the patient's medication list. He is not on any blood thinners. Not using any aspirin products. Not using any anti-inflammatory medications.  We have also discussed the post-operative recovery period to include: bathing/showering restrictions, wound healing, activity (and driving)  restrictions, medications/pain mangement.  We have also discussed post-operative redflags to include: signs and symptoms of postoperative infection, DVT/PE.  All patient's questions were answered  Follow-up: 2 weeks post

## 2020-11-11 NOTE — Progress Notes (Signed)
Your procedure is scheduled on Nov 14, 2020 Thursday.  Report to [facility] Main Entrance "A" at 05:30 A.M., and check in at the Admitting office.  Call this number if you have problems the morning of surgery: [phone]  Call [phone] if you have any questions prior to your surgery date Monday-Friday 8am-4pm   Remember: Do not eat or drink after midnight the night before your surgery   Take these medicines the morning of surgery with A SIP OF WATER:  If needed: acetaminophen (TYLENOL) Eye drops  As of today, STOP taking any Aspirin (unless otherwise instructed by your surgeon), Aleve, Naproxen, Ibuprofen, Motrin, Advil, Goody's, BC's, all herbal medications, fish oil, and all vitamins.    The Morning of Surgery  Do not wear jewelry, make-up or nail polish.  Do not wear lotions, powders, or perfumes, or deodorant Men may shave face and neck.  Do not bring valuables to the hospital.  [facility] is not responsible for any belongings or valuables.  If you are a smoker, DO NOT Smoke 24 hours prior to surgery  If you wear a CPAP at night please bring your mask the morning of surgery   Remember that you must have someone to transport you home after your surgery, and remain with you for 24 hours if you are discharged the same day.   Please bring cases for contacts, glasses, hearing aids, dentures or bridgework because it cannot be worn into surgery.    Leave your suitcase in the car.  After surgery it may be brought to your room.  For patients admitted to the hospital, discharge time will be determined by your treatment team.  Patients discharged the day of surgery will not be allowed to drive home.    Special instructions:   [facility]- Preparing For Surgery  Before surgery, you can play an important role. Because skin is not sterile, your skin needs to be as free of germs as possible. You can reduce the number of germs on your skin by washing with CHG  (chlorahexidine gluconate) Soap before surgery.  CHG is an antiseptic cleaner which kills germs and bonds with the skin to continue killing germs even after washing.    Oral Hygiene is also important to reduce your risk of infection.  Remember - BRUSH YOUR TEETH THE MORNING OF SURGERY WITH YOUR REGULAR TOOTHPASTE  Please do not use if you have an allergy to CHG or antibacterial soaps. If your skin becomes reddened/irritated stop using the CHG.  Do not shave (including legs and underarms) for at least 48 hours prior to first CHG shower. It is OK to shave your face.  Please follow these instructions carefully.   1. Shower the NIGHT BEFORE SURGERY and the MORNING OF SURGERY with CHG Soap.   2. If you chose to wash your hair and body, wash as usual with your normal shampoo and body-wash/soap.  3. Rinse your hair and body thoroughly to remove the shampoo and soap.  4. Apply CHG directly to the skin (ONLY FROM THE NECK DOWN) and wash gently with a scrungie or a clean washcloth.   5. Do not use on open wounds or open sores. Avoid contact with your eyes, ears, mouth and genitals (private parts). Wash Face and genitals (private parts)  with your normal soap.   6. Wash thoroughly, paying special attention to the area where your surgery will be performed.  7. Thoroughly rinse your body with warm water from the neck down.  8. DO NOT shower/wash with your normal soap after using and rinsing off the CHG Soap.  9. Pat yourself dry with a CLEAN TOWEL.  10. Wear CLEAN PAJAMAS to bed the night before surgery  11. Place CLEAN SHEETS on your bed the night of your first shower and DO NOT SLEEP WITH PETS.  12. Wear comfortable clothes the morning of surgery.     Day of Surgery:  Please shower the morning of surgery with the CHG soap Do not apply any deodorants/lotions. Please wear clean clothes to the hospital/surgery center.   Remember to brush your teeth WITH YOUR REGULAR TOOTHPASTE.   Please  read over the following fact sheets that you were given.

## 2020-11-12 ENCOUNTER — Other Ambulatory Visit (HOSPITAL_COMMUNITY)
Admission: RE | Admit: 2020-11-12 | Discharge: 2020-11-12 | Disposition: A | Discharge status: Home / Self Care | Payer: Medicare Other | Source: Ambulatory Visit | Attending: Orthopedic Surgery | Admitting: Orthopedic Surgery

## 2020-11-12 ENCOUNTER — Ambulatory Visit: Payer: Self-pay | Admitting: Orthopedic Surgery

## 2020-11-12 ENCOUNTER — Encounter (HOSPITAL_COMMUNITY)
Admission: RE | Admit: 2020-11-12 | Discharge: 2020-11-12 | Disposition: A | Discharge status: Home / Self Care | Payer: Medicare Other | Source: Ambulatory Visit | Attending: Orthopedic Surgery | Admitting: Orthopedic Surgery

## 2020-11-12 ENCOUNTER — Other Ambulatory Visit: Payer: Self-pay

## 2020-11-12 ENCOUNTER — Encounter (HOSPITAL_COMMUNITY): Payer: Self-pay

## 2020-11-12 ENCOUNTER — Ambulatory Visit (HOSPITAL_COMMUNITY)
Admission: RE | Admit: 2020-11-12 | Discharge: 2020-11-12 | Disposition: A | Discharge status: Home / Self Care | Payer: Medicare Other | Source: Ambulatory Visit | Attending: Orthopedic Surgery | Admitting: Orthopedic Surgery

## 2020-11-12 DIAGNOSIS — Z01818 Encounter for other preprocedural examination: Secondary | ICD-10-CM | POA: Insufficient documentation

## 2020-11-12 DIAGNOSIS — M48062 Spinal stenosis, lumbar region with neurogenic claudication: Secondary | ICD-10-CM | POA: Diagnosis not present

## 2020-11-12 DIAGNOSIS — G9761 Postprocedural hematoma of a nervous system organ or structure following a nervous system procedure: Secondary | ICD-10-CM | POA: Diagnosis not present

## 2020-11-12 DIAGNOSIS — Z20822 Contact with and (suspected) exposure to covid-19: Secondary | ICD-10-CM | POA: Insufficient documentation

## 2020-11-12 DIAGNOSIS — E119 Type 2 diabetes mellitus without complications: Secondary | ICD-10-CM | POA: Diagnosis not present

## 2020-11-12 HISTORY — DX: Type 2 diabetes mellitus without complications: E11.9

## 2020-11-12 HISTORY — DX: Polyneuropathy, unspecified: G62.9

## 2020-11-12 HISTORY — DX: Unilateral inguinal hernia, without obstruction or gangrene, not specified as recurrent: K40.90

## 2020-11-12 LAB — BASIC METABOLIC PANEL
Anion gap: 10 (ref 5–15)
BUN: 10 mg/dL (ref 8–23)
CO2: 23 mmol/L (ref 22–32)
Calcium: 9.4 mg/dL (ref 8.9–10.3)
Chloride: 104 mmol/L (ref 98–111)
Creatinine, Ser: 0.82 mg/dL (ref 0.61–1.24)
GFR, Estimated: >60 mL/min (ref >60)
Glucose, Bld: 154 mg/dL — ABNORMAL HIGH (ref 70–99)
Potassium: 3.6 mmol/L (ref 3.5–5.1)
Sodium: 137 mmol/L (ref 135–145)

## 2020-11-12 LAB — CBC
HCT: 46.3 % (ref 39.0–52.0)
Hemoglobin: 14.7 g/dL (ref 13.0–17.0)
MCH: 26.4 pg (ref 26.0–34.0)
MCHC: 31.7 g/dL (ref 30.0–36.0)
MCV: 83.1 fL (ref 80.0–100.0)
Platelets: 227 10*3/uL (ref 150–400)
RBC: 5.57 MIL/uL (ref 4.22–5.81)
RDW: 14.2 % (ref 11.5–15.5)
WBC: 5.7 10*3/uL (ref 4.0–10.5)
nRBC: 0 % (ref 0.0–0.2)

## 2020-11-12 LAB — URINALYSIS, ROUTINE W REFLEX MICROSCOPIC
Bilirubin Urine: NEGATIVE
Glucose, UA: NEGATIVE mg/dL
Hgb urine dipstick: NEGATIVE
Ketones, ur: NEGATIVE mg/dL
Leukocytes,Ua: NEGATIVE
Nitrite: NEGATIVE
Protein, ur: NEGATIVE mg/dL
Specific Gravity, Urine: 1.02 (ref 1.005–1.030)
pH: 6 (ref 5.0–8.0)

## 2020-11-12 LAB — HEMOGLOBIN A1C
Hgb A1c MFr Bld: 6.8 % — ABNORMAL HIGH (ref 4.8–5.6)
Mean Plasma Glucose: 148.46 mg/dL

## 2020-11-12 LAB — APTT: aPTT: 30 seconds (ref 24–36)

## 2020-11-12 LAB — PROTIME-INR
INR: 1.1 (ref 0.8–1.2)
Prothrombin Time: 13.3 seconds (ref 11.4–15.2)

## 2020-11-12 LAB — GLUCOSE, CAPILLARY: Glucose-Capillary: 170 mg/dL — ABNORMAL HIGH (ref 70–99)

## 2020-11-12 LAB — SURGICAL PCR SCREEN
MRSA, PCR: NEGATIVE
Staphylococcus aureus: NEGATIVE

## 2020-11-12 NOTE — Progress Notes (Signed)
Your procedure is scheduled on Nov 14, 2020 Thursday.  Report to [facility] Main Entrance "A" at 05:30 A.M., and check in at the Admitting office.  Call this number if you have problems the morning of surgery: [phone]  Call [phone] if you have any questions prior to your surgery date Monday-Friday 8am-4pm  Remember: Do not eat or drink after midnight the night before your surgery   Take these medicines the morning of surgery with A SIP OF WATER:  If needed: acetaminophen (TYLENOL) Eye drops  As of today, STOP taking any Aspirin (unless otherwise instructed by your surgeon), Aleve, Naproxen, Ibuprofen, Motrin, Advil, Goody's, BC's, all herbal medications, fish oil, and all vitamins.   WHAT DO I DO ABOUT MY DIABETES MEDICATION?  Marland Kitchen Do not take metFORMIN (GLUCOPHAGE)  take oral diabetes medicines (pills) the morning of surgery.  HOW TO MANAGE YOUR DIABETES BEFORE AND AFTER SURGERY  Why is it important to control my blood sugar before and after surgery? . Improving blood sugar levels before and after surgery helps healing and can limit problems. . A way of improving blood sugar control is eating a healthy diet by: o  Eating less sugar and carbohydrates o  Increasing activity/exercise o  Talking with your doctor about reaching your blood sugar goals . High blood sugars (greater than 180 mg/dL) can raise your risk of infections and slow your recovery, so you will need to focus on controlling your diabetes during the weeks before surgery. . Make sure that the doctor who takes care of your diabetes knows about your planned surgery including the date and location.  How do I manage my blood sugar before surgery? . Check your blood sugar at least 4 times a day, starting 2 days before surgery, to make sure that the level is not too high or low. . Check your blood sugar the morning of your surgery when you wake up and every 2 hours until you get to the Short Stay unit. o If your  blood sugar is less than 70 mg/dL, you will need to treat for low blood sugar: - Do not take insulin. - Treat a low blood sugar (less than 70 mg/dL) with  cup of clear juice (cranberry or apple), 4 glucose tablets, OR glucose gel. - Recheck blood sugar in 15 minutes after treatment (to make sure it is greater than 70 mg/dL). If your blood sugar is not greater than 70 mg/dL on recheck, call [phone] for further instructions. . Report your blood sugar to the short stay nurse when you get to Short Stay.  . If you are admitted to the hospital after surgery: o Your blood sugar will be checked by the staff and you will probably be given insulin after surgery (instead of oral diabetes medicines) to make sure you have good blood sugar levels. o The goal for blood sugar control after surgery is 80-180 mg/dL.  The Morning of Surgery  Do not wear jewelry.  Do not wear lotions, powders, colgne, or deodorant Men may shave face and neck.  Do not bring valuables to the hospital.  [facility] is not responsible for any belongings or valuables.  If you are a smoker, DO NOT Smoke 24 hours prior to surgery  If you wear a CPAP at night please bring your mask the morning of surgery   Remember that you must have someone to transport you home after your surgery, and remain with you for 24 hours if you are discharged the same  day.  Please bring cases for contacts, glasses, hearing aids, dentures or bridgework because it cannot be worn into surgery.   Leave your suitcase in the car.  After surgery it may be brought to your room.  For patients admitted to the hospital, discharge time will be determined by your treatment team.  Patients discharged the day of surgery will not be allowed to drive home.   Special instructions:   [place]- Preparing For Surgery  Before surgery, you can play an important role. Because skin is not sterile, your skin needs to be as free of germs as possible. You can reduce  the number of germs on your skin by washing with CHG (chlorahexidine gluconate) Soap before surgery.  CHG is an antiseptic cleaner which kills germs and bonds with the skin to continue killing germs even after washing.    Oral Hygiene is also important to reduce your risk of infection.  Remember - BRUSH YOUR TEETH THE MORNING OF SURGERY WITH YOUR REGULAR TOOTHPASTE  Please do not use if you have an allergy to CHG or antibacterial soaps. If your skin becomes reddened/irritated stop using the CHG.  Do not shave (including legs and underarms) for at least 48 hours prior to first CHG shower. It is OK to shave your face.  Please follow these instructions carefully.   1. Shower the NIGHT BEFORE SURGERY and the MORNING OF SURGERY with CHG Soap.   2. If you chose to wash your hair and body, wash as usual with your normal shampoo and body-wash/soap.  3. Rinse your hair and body thoroughly to remove the shampoo and soap.  4. Apply CHG directly to the skin (ONLY FROM THE NECK DOWN) and wash gently with a scrungie or a clean washcloth.   5. Do not use on open wounds or open sores. Avoid contact with your eyes, ears, mouth and genitals (private parts). Wash Face and genitals (private parts)  with your normal soap.   6. Wash thoroughly, paying special attention to the area where your surgery will be performed.  7. Thoroughly rinse your body with warm water from the neck down.  8. DO NOT shower/wash with your normal soap after using and rinsing off the CHG Soap.  9. Pat yourself dry with a CLEAN TOWEL.  10. Wear CLEAN PAJAMAS to bed the night before surgery  11. Place CLEAN SHEETS on your bed the night of your first shower and DO NOT SLEEP WITH PETS.  12. Wear comfortable clothes the morning of surgery.     Day of Surgery:  Please shower the morning of surgery with the CHG soap Do not apply any deodorants/lotions. Please wear clean clothes to the hospital/surgery center.   Remember to brush  your teeth WITH YOUR REGULAR TOOTHPASTE.   Please read over the following fact sheets that you were given.

## 2020-11-12 NOTE — Progress Notes (Signed)
PCP - [facility] Cardiologist - denies  PPM/ICD - denies  Chest x-ray - 11/12/20 EKG - 11/12/20 Stress Test - denies ECHO - denies Cardiac Cath - denies   OSA/CPAP - per patient, "wears CPAP most nights 25 nights out of 30 nights  Fasting Blood Sugar - averages about 120 Checks Blood Sugar about 1x/day  Blood Thinner Instructions: N/A Aspirin Instructions: N/A  ERAS Protcol - No  COVID TEST- Scheduled for today 11/12/2020. Patient verbalized understanding of self-quarantine instructions, appointment time and place.  Anesthesia review: YES, per MD order, records requested from [facility]  Patient denies shortness of breath, fever, cough and chest pain at PAT appointment  All instructions explained to the patient, with a verbal understanding of the material. Patient agrees to go over the instructions while at home for a better understanding. Patient also instructed to self quarantine after being tested for COVID-19. The opportunity to ask questions was provided.

## 2020-11-13 LAB — SARS CORONAVIRUS 2 (TAT 6-24 HRS): SARS Coronavirus 2: NEGATIVE

## 2020-11-14 ENCOUNTER — Ambulatory Visit (HOSPITAL_COMMUNITY): Payer: Medicare Other | Admitting: Anesthesiology

## 2020-11-14 ENCOUNTER — Ambulatory Visit (HOSPITAL_COMMUNITY): Payer: Medicare Other | Admitting: Physician Assistant

## 2020-11-14 ENCOUNTER — Ambulatory Visit (HOSPITAL_COMMUNITY): Payer: Medicare Other

## 2020-11-14 ENCOUNTER — Encounter (HOSPITAL_COMMUNITY): Payer: Self-pay | Admitting: Orthopedic Surgery

## 2020-11-14 ENCOUNTER — Other Ambulatory Visit: Payer: Self-pay

## 2020-11-14 ENCOUNTER — Ambulatory Visit (HOSPITAL_COMMUNITY)
Admission: RE | Disposition: A | Discharge status: Home / Self Care | Payer: Self-pay | Source: Home / Self Care | Attending: Orthopedic Surgery

## 2020-11-14 ENCOUNTER — Ambulatory Visit (HOSPITAL_COMMUNITY)
Admission: RE | Admit: 2020-11-14 | Discharge: 2020-11-14 | Disposition: A | Discharge status: Home / Self Care | Payer: Medicare Other | Source: Home / Self Care | Attending: Orthopedic Surgery | Admitting: Orthopedic Surgery

## 2020-11-14 DIAGNOSIS — G9761 Postprocedural hematoma of a nervous system organ or structure following a nervous system procedure: Secondary | ICD-10-CM | POA: Diagnosis not present

## 2020-11-14 DIAGNOSIS — Z96651 Presence of right artificial knee joint: Secondary | ICD-10-CM | POA: Insufficient documentation

## 2020-11-14 DIAGNOSIS — Z7901 Long term (current) use of anticoagulants: Secondary | ICD-10-CM | POA: Insufficient documentation

## 2020-11-14 DIAGNOSIS — M21371 Foot drop, right foot: Secondary | ICD-10-CM | POA: Insufficient documentation

## 2020-11-14 DIAGNOSIS — Z7984 Long term (current) use of oral hypoglycemic drugs: Secondary | ICD-10-CM | POA: Insufficient documentation

## 2020-11-14 DIAGNOSIS — Z791 Long term (current) use of non-steroidal anti-inflammatories (NSAID): Secondary | ICD-10-CM | POA: Insufficient documentation

## 2020-11-14 DIAGNOSIS — Z20822 Contact with and (suspected) exposure to covid-19: Secondary | ICD-10-CM | POA: Diagnosis not present

## 2020-11-14 DIAGNOSIS — E119 Type 2 diabetes mellitus without complications: Secondary | ICD-10-CM | POA: Diagnosis not present

## 2020-11-14 DIAGNOSIS — Z419 Encounter for procedure for purposes other than remedying health state, unspecified: Secondary | ICD-10-CM

## 2020-11-14 DIAGNOSIS — Z87891 Personal history of nicotine dependence: Secondary | ICD-10-CM | POA: Insufficient documentation

## 2020-11-14 DIAGNOSIS — M48062 Spinal stenosis, lumbar region with neurogenic claudication: Secondary | ICD-10-CM | POA: Insufficient documentation

## 2020-11-14 HISTORY — PX: LUMBAR LAMINECTOMY/DECOMPRESSION MICRODISCECTOMY: SHX5026

## 2020-11-14 LAB — GLUCOSE, CAPILLARY
Glucose-Capillary: 129 mg/dL — ABNORMAL HIGH (ref 70–99)
Glucose-Capillary: 131 mg/dL — ABNORMAL HIGH (ref 70–99)

## 2020-11-14 SURGERY — LUMBAR LAMINECTOMY/DECOMPRESSION MICRODISCECTOMY 2 LEVELS
Anesthesia: General | Site: Spine Lumbar

## 2020-11-14 MED ORDER — OXYCODONE HCL 5 MG PO TABS
ORAL_TABLET | ORAL | Status: AC
  Filled 2020-11-14: qty 1

## 2020-11-14 MED ORDER — ALBUMIN HUMAN 5 % IV SOLN: INTRAVENOUS | Status: DC | PRN

## 2020-11-14 MED ORDER — SUGAMMADEX SODIUM 200 MG/2ML IV SOLN
INTRAVENOUS | Status: DC | PRN
  Administered 2020-11-14: 200 mg via INTRAVENOUS

## 2020-11-14 MED ORDER — METHOCARBAMOL 500 MG PO TABS: 500.0000 mg | ORAL_TABLET | Freq: Three times a day (TID) | ORAL | 0 refills | Status: AC | PRN

## 2020-11-14 MED ORDER — PROPOFOL 10 MG/ML IV BOLUS
INTRAVENOUS | Status: DC | PRN
  Administered 2020-11-14: 160 mg via INTRAVENOUS

## 2020-11-14 MED ORDER — SUCCINYLCHOLINE CHLORIDE 200 MG/10ML IV SOSY
PREFILLED_SYRINGE | INTRAVENOUS | Status: AC
  Filled 2020-11-14: qty 10

## 2020-11-14 MED ORDER — OXYCODONE-ACETAMINOPHEN 10-325 MG PO TABS: 1.0000 | ORAL_TABLET | Freq: Four times a day (QID) | ORAL | 0 refills | Status: AC | PRN

## 2020-11-14 MED ORDER — MIDAZOLAM HCL 2 MG/2ML IJ SOLN
INTRAMUSCULAR | Status: AC
  Filled 2020-11-14: qty 2

## 2020-11-14 MED ORDER — FENTANYL CITRATE (PF) 100 MCG/2ML IJ SOLN: 25.0000 ug | INTRAMUSCULAR | Status: DC | PRN

## 2020-11-14 MED ORDER — FENTANYL CITRATE (PF) 100 MCG/2ML IJ SOLN
INTRAMUSCULAR | Status: DC | PRN
  Administered 2020-11-14: 100 ug via INTRAVENOUS

## 2020-11-14 MED ORDER — LIDOCAINE 2% (20 MG/ML) 5 ML SYRINGE
INTRAMUSCULAR | Status: DC | PRN
  Administered 2020-11-14: 50 mg via INTRAVENOUS

## 2020-11-14 MED ORDER — LIDOCAINE 2% (20 MG/ML) 5 ML SYRINGE
INTRAMUSCULAR | Status: AC
  Filled 2020-11-14: qty 5

## 2020-11-14 MED ORDER — HEMOSTATIC AGENTS (NO CHARGE) OPTIME
TOPICAL | Status: DC | PRN
  Administered 2020-11-14: 2

## 2020-11-14 MED ORDER — ACETAMINOPHEN 10 MG/ML IV SOLN
INTRAVENOUS | Status: AC
  Filled 2020-11-14: qty 100

## 2020-11-14 MED ORDER — ORAL CARE MOUTH RINSE: 15.0000 mL | Freq: Once | OROMUCOSAL | Status: AC

## 2020-11-14 MED ORDER — 0.9 % SODIUM CHLORIDE (POUR BTL) OPTIME
TOPICAL | Status: DC | PRN
  Administered 2020-11-14 (×2): 1000 mL

## 2020-11-14 MED ORDER — BUPIVACAINE-EPINEPHRINE (PF) 0.25% -1:200000 IJ SOLN
INTRAMUSCULAR | Status: DC | PRN
  Administered 2020-11-14: 10 mL
  Administered 2020-11-14: 20 mL

## 2020-11-14 MED ORDER — LACTATED RINGERS IV SOLN: INTRAVENOUS | Status: DC

## 2020-11-14 MED ORDER — MIDAZOLAM HCL 5 MG/5ML IJ SOLN
INTRAMUSCULAR | Status: DC | PRN
  Administered 2020-11-14: 2 mg via INTRAVENOUS

## 2020-11-14 MED ORDER — ACETAMINOPHEN 10 MG/ML IV SOLN
INTRAVENOUS | Status: DC | PRN
  Administered 2020-11-14: 1000 mg via INTRAVENOUS

## 2020-11-14 MED ORDER — ONDANSETRON HCL 4 MG/2ML IJ SOLN
INTRAMUSCULAR | Status: AC
  Filled 2020-11-14: qty 2

## 2020-11-14 MED ORDER — CEFAZOLIN SODIUM-DEXTROSE 2-3 GM-%(50ML) IV SOLR: INTRAVENOUS | Status: DC | PRN

## 2020-11-14 MED ORDER — ROCURONIUM BROMIDE 10 MG/ML (PF) SYRINGE
PREFILLED_SYRINGE | INTRAVENOUS | Status: AC
  Filled 2020-11-14: qty 10

## 2020-11-14 MED ORDER — FENTANYL CITRATE (PF) 250 MCG/5ML IJ SOLN
INTRAMUSCULAR | Status: AC
  Filled 2020-11-14: qty 5

## 2020-11-14 MED ORDER — OXYCODONE HCL 5 MG/5ML PO SOLN
5.0000 mg | Freq: Once | ORAL | Status: AC | PRN
Start: 2020-11-14 — End: 2020-11-14

## 2020-11-14 MED ORDER — CHLORHEXIDINE GLUCONATE 0.12 % MT SOLN
15.0000 mL | Freq: Once | OROMUCOSAL | Status: AC
  Administered 2020-11-14: 15 mL via OROMUCOSAL
  Filled 2020-11-14: qty 15

## 2020-11-14 MED ORDER — METHOCARBAMOL 500 MG PO TABS
500.0000 mg | ORAL_TABLET | Freq: Once | ORAL | Status: AC
  Administered 2020-11-14: 500 mg via ORAL

## 2020-11-14 MED ORDER — BUPIVACAINE HCL (PF) 0.25 % IJ SOLN
INTRAMUSCULAR | Status: AC
  Filled 2020-11-14: qty 30

## 2020-11-14 MED ORDER — PHENYLEPHRINE 40 MCG/ML (10ML) SYRINGE FOR IV PUSH (FOR BLOOD PRESSURE SUPPORT)
PREFILLED_SYRINGE | INTRAVENOUS | Status: DC | PRN
  Administered 2020-11-14: 120 ug via INTRAVENOUS
  Administered 2020-11-14 (×2): 80 ug via INTRAVENOUS
  Administered 2020-11-14: 120 ug via INTRAVENOUS

## 2020-11-14 MED ORDER — METHOCARBAMOL 500 MG PO TABS
ORAL_TABLET | ORAL | Status: AC
  Filled 2020-11-14: qty 1

## 2020-11-14 MED ORDER — EPINEPHRINE PF 1 MG/ML IJ SOLN
INTRAMUSCULAR | Status: AC
  Filled 2020-11-14: qty 1

## 2020-11-14 MED ORDER — THROMBIN 20000 UNITS EX SOLR: CUTANEOUS | Status: DC | PRN

## 2020-11-14 MED ORDER — ROCURONIUM BROMIDE 10 MG/ML (PF) SYRINGE
PREFILLED_SYRINGE | INTRAVENOUS | Status: DC | PRN
  Administered 2020-11-14: 60 mg via INTRAVENOUS
  Administered 2020-11-14 (×2): 20 mg via INTRAVENOUS

## 2020-11-14 MED ORDER — BUPIVACAINE LIPOSOME 1.3 % IJ SUSP
20.0000 mL | Freq: Once | INTRAMUSCULAR | Status: AC
  Administered 2020-11-14: 20 mL
  Filled 2020-11-14: qty 20

## 2020-11-14 MED ORDER — ONDANSETRON HCL 4 MG/2ML IJ SOLN
INTRAMUSCULAR | Status: DC | PRN
  Administered 2020-11-14: 4 mg via INTRAVENOUS

## 2020-11-14 MED ORDER — TRANEXAMIC ACID-NACL 1000-0.7 MG/100ML-% IV SOLN
INTRAVENOUS | Status: AC
  Filled 2020-11-14: qty 100

## 2020-11-14 MED ORDER — PROPOFOL 10 MG/ML IV BOLUS
INTRAVENOUS | Status: AC
  Filled 2020-11-14: qty 40

## 2020-11-14 MED ORDER — OXYCODONE HCL 5 MG PO TABS
5.0000 mg | ORAL_TABLET | Freq: Once | ORAL | Status: AC | PRN
  Administered 2020-11-14: 5 mg via ORAL

## 2020-11-14 MED ORDER — THROMBIN (RECOMBINANT) 20000 UNITS EX SOLR
CUTANEOUS | Status: AC
  Filled 2020-11-14: qty 20000

## 2020-11-14 MED ORDER — ONDANSETRON HCL 4 MG PO TABS: 4.0000 mg | ORAL_TABLET | Freq: Three times a day (TID) | ORAL | 0 refills | Status: AC | PRN

## 2020-11-14 MED ORDER — CEFAZOLIN SODIUM-DEXTROSE 2-4 GM/100ML-% IV SOLN
2.0000 g | INTRAVENOUS | Status: AC
  Administered 2020-11-14: 2 g via INTRAVENOUS
  Filled 2020-11-14: qty 100

## 2020-11-14 MED ORDER — TRANEXAMIC ACID-NACL 1000-0.7 MG/100ML-% IV SOLN
INTRAVENOUS | Status: DC | PRN
  Administered 2020-11-14: 1000 mg via INTRAVENOUS

## 2020-11-14 MED ORDER — PHENYLEPHRINE HCL-NACL 10-0.9 MG/250ML-% IV SOLN
INTRAVENOUS | Status: DC | PRN
  Administered 2020-11-14: 20 ug/min via INTRAVENOUS

## 2020-11-14 MED ORDER — ONDANSETRON HCL 4 MG/2ML IJ SOLN
4.0000 mg | Freq: Four times a day (QID) | INTRAMUSCULAR | Status: DC | PRN
Start: 2020-11-14 — End: 2020-11-14

## 2020-11-14 MED ORDER — METHYLPREDNISOLONE ACETATE 40 MG/ML IJ SUSP
INTRAMUSCULAR | Status: AC
  Filled 2020-11-14: qty 1

## 2020-11-14 SURGICAL SUPPLY — 56 items
AGENT HMST KT MTR STRL THRMB (HEMOSTASIS) ×2
BAND INSRT 18 STRL LF DISP RB (MISCELLANEOUS)
BAND RUBBER #18 3X1/16 STRL (MISCELLANEOUS) IMPLANT
CLSR STERI-STRIP ANTIMIC 1/2X4 (GAUZE/BANDAGES/DRESSINGS) ×2 IMPLANT
CORD BIPOLAR FORCEPS 12FT (ELECTRODE) ×2 IMPLANT
COVER WAND RF STERILE (DRAPES) ×2 IMPLANT
DRAIN CHANNEL 15F RND FF W/TCR (WOUND CARE) IMPLANT
DRAPE MICROSCOPE LEICA 46X105 (MISCELLANEOUS) IMPLANT
DRAPE POUCH INSTRU U-SHP 10X18 (DRAPES) ×2 IMPLANT
DRAPE SURG 17X11 SM STRL (DRAPES) ×2 IMPLANT
DRAPE U-SHAPE 47X51 STRL (DRAPES) ×2 IMPLANT
DRSG OPSITE POSTOP 4X6 (GAUZE/BANDAGES/DRESSINGS) ×2 IMPLANT
DURAPREP 26ML APPLICATOR (WOUND CARE) ×2 IMPLANT
ELECT BLADE 4.0 EZ CLEAN MEGAD (MISCELLANEOUS)
ELECT CAUTERY BLADE 6.4 (BLADE) ×2 IMPLANT
ELECT PENCIL ROCKER SW 15FT (MISCELLANEOUS) ×2 IMPLANT
ELECT REM PT RETURN 9FT ADLT (ELECTROSURGICAL) ×2
ELECTRODE BLDE 4.0 EZ CLN MEGD (MISCELLANEOUS) IMPLANT
ELECTRODE REM PT RTRN 9FT ADLT (ELECTROSURGICAL) ×1 IMPLANT
EVACUATOR SILICONE 100CC (DRAIN) IMPLANT
GLOVE BIO SURGEON STRL SZ 6.5 (GLOVE) ×2 IMPLANT
GLOVE BIOGEL PI IND STRL 8.5 (GLOVE) ×1 IMPLANT
GLOVE BIOGEL PI INDICATOR 8.5 (GLOVE) ×1
GLOVE SS BIOGEL STRL SZ 8.5 (GLOVE) ×1 IMPLANT
GLOVE SUPERSENSE BIOGEL SZ 8.5 (GLOVE) ×1
GLOVE SURG UNDER POLY LF SZ6.5 (GLOVE) ×2 IMPLANT
GOWN STRL REUS W/ TWL LRG LVL3 (GOWN DISPOSABLE) ×1 IMPLANT
GOWN STRL REUS W/TWL 2XL LVL3 (GOWN DISPOSABLE) ×4 IMPLANT
GOWN STRL REUS W/TWL LRG LVL3 (GOWN DISPOSABLE) ×2
KIT BASIN OR (CUSTOM PROCEDURE TRAY) ×2 IMPLANT
NEEDLE 22X1 1/2 (OR ONLY) (NEEDLE) ×2 IMPLANT
NEEDLE SPNL 18GX3.5 QUINCKE PK (NEEDLE) ×4 IMPLANT
NS IRRIG 1000ML POUR BTL (IV SOLUTION) ×2 IMPLANT
PACK LAMINECTOMY ORTHO (CUSTOM PROCEDURE TRAY) ×2 IMPLANT
PACK UNIVERSAL I (CUSTOM PROCEDURE TRAY) ×2 IMPLANT
PATTIES SURGICAL .5 X.5 (GAUZE/BANDAGES/DRESSINGS) ×6 IMPLANT
PATTIES SURGICAL .5 X1 (DISPOSABLE) ×4 IMPLANT
SPONGE LAP 4X18 RFD (DISPOSABLE) IMPLANT
SPONGE SURGIFOAM ABS GEL 100 (HEMOSTASIS) ×2 IMPLANT
STAPLER VISISTAT 35W (STAPLE) IMPLANT
SURGIFLO W/THROMBIN 8M KIT (HEMOSTASIS) ×4 IMPLANT
SUT BONE WAX W31G (SUTURE) ×2 IMPLANT
SUT MNCRL AB 3-0 PS2 27 (SUTURE) ×2 IMPLANT
SUT VIC AB 1 CT1 18XCR BRD 8 (SUTURE) ×1 IMPLANT
SUT VIC AB 1 CT1 27 (SUTURE)
SUT VIC AB 1 CT1 27XBRD ANTBC (SUTURE) IMPLANT
SUT VIC AB 1 CT1 8-18 (SUTURE) ×2
SUT VIC AB 2-0 CT1 18 (SUTURE) ×2 IMPLANT
SUT VICRYL 0 UR6 27IN ABS (SUTURE) ×2 IMPLANT
SYR BULB IRRIG 60ML STRL (SYRINGE) ×2 IMPLANT
SYR CONTROL 10ML LL (SYRINGE) ×2 IMPLANT
TOWEL GREEN STERILE (TOWEL DISPOSABLE) ×2 IMPLANT
TOWEL GREEN STERILE FF (TOWEL DISPOSABLE) ×2 IMPLANT
TRAY FOLEY MTR SLVR 16FR STAT (SET/KITS/TRAYS/PACK) ×2 IMPLANT
WATER STERILE IRR 1000ML POUR (IV SOLUTION) ×2 IMPLANT
YANKAUER SUCT BULB TIP NO VENT (SUCTIONS) IMPLANT

## 2020-11-14 NOTE — Transfer of Care (Signed)
Immediate Anesthesia Transfer of Care Note  Patient: Anthony Nash  Procedure(s) Performed: Lumbar Three-Lumbar Five Decompression (N/A Spine Lumbar)  Patient Location: PACU  Anesthesia Type:General  Level of Consciousness: awake and drowsy  Airway & Oxygen Therapy: Patient Spontanous Breathing and Patient connected to face mask oxygen  Post-op Assessment: Report given to RN and Post -op Vital signs reviewed and stable  Post vital signs: Reviewed and stable  Last Vitals:  Vitals Value Taken Time  BP 114/80 11/14/20 1132  Temp    Pulse 79 11/14/20 1135  Resp 19 11/14/20 1135  SpO2 100 % 11/14/20 1135  Vitals shown include unvalidated device data.  Last Pain:  Vitals:   11/14/20 0622  TempSrc:   PainSc: 6       Patients Stated Pain Goal: 5 (31/54/00 8676)  Complications: No complications documented.

## 2020-11-14 NOTE — Brief Op Note (Signed)
11/14/2020  11:06 AM  PATIENT:  Rollins 54  70 y.o. male  PRE-OPERATIVE DIAGNOSIS:  Lumbar spinal stenosis with neurogenic claudication  POST-OPERATIVE DIAGNOSIS:  Lumbar spinal stenosis with neurogenic claudication  PROCEDURE:  Procedure(s) with comments: Lumbar Three-Lumbar Five Decompression (N/A) - 3 hrs  SURGEON:  Surgeon(s) and Role:    Melina Schools, MD - Primary  PHYSICIAN ASSISTANT: Cleta Alberts, PA   ASSISTANTS: Estill Bamberg Ward, PA   ANESTHESIA:   general  EBL:  100 mL   BLOOD ADMINISTERED:none  DRAINS: none   LOCAL MEDICATIONS USED:  MARCAINE    and OTHER exparel  SPECIMEN:  No Specimen  DISPOSITION OF SPECIMEN:  N/A  COUNTS:  YES  TOURNIQUET:  * No tourniquets in log *  DICTATION: .Dragon Dictation  PLAN OF CARE: Discharge to home after PACU  PATIENT DISPOSITION:  PACU - hemodynamically stable.

## 2020-11-14 NOTE — Anesthesia Postprocedure Evaluation (Signed)
Anesthesia Post Note  Patient: Anthony Retail banker) Performed: Lumbar Three-Lumbar Five Decompression (N/A Spine Lumbar)     Patient location during evaluation: PACU Anesthesia Type: General Level of consciousness: awake and alert Pain management: pain level controlled Vital Signs Assessment: post-procedure vital signs reviewed and stable Respiratory status: spontaneous breathing, nonlabored ventilation, respiratory function stable and patient connected to nasal cannula oxygen Cardiovascular status: blood pressure returned to baseline and stable Postop Assessment: no apparent nausea or vomiting Anesthetic complications: no   No complications documented.  Last Vitals:  Vitals:   11/14/20 1200 11/14/20 1215  BP: 122/79 118/74  Pulse: 71 68  Resp: 16 18  Temp:    SpO2: 95% 96%    Last Pain:  Vitals:   11/14/20 1200  TempSrc:   PainSc: [place]

## 2020-11-14 NOTE — Addendum Note (Signed)
Addendum  created 11/14/20 1310 by Hoy Morn, CRNA   Intraprocedure Meds edited

## 2020-11-14 NOTE — Discharge Instructions (Signed)

## 2020-11-14 NOTE — H&P (Signed)
Addendum H&P.  Anthony Nash presents today for lumbar decompression for lumbar spinal stenosis with neurogenic claudication.  There is been no change in his clinical exam since his last office visit of 11/08/2020.  Patient continues to have significant back buttock and neuropathic leg pain with a left foot drop.  Imaging studies demonstrate significant lumbar stenosis at L4-5 with loss of normal CSF signal and significant osteophyte formation.  There is moderate spinal stenosis at L3-4.  As result of the neurological deficits and significant radicular pain we have elected to move forward with surgery.  All appropriate risks benefits and alternatives were discussed with the patient and consent was obtained.  All of his questions were encouraged and addressed.

## 2020-11-14 NOTE — Anesthesia Preprocedure Evaluation (Signed)
Anesthesia Evaluation  Patient identified by MRN, date of birth, ID band Patient awake    Reviewed: Allergy & Precautions, H&P , NPO status , Patient's Chart, lab work & pertinent test results  Airway Mallampati: II   Neck ROM: full    Dental   Pulmonary sleep apnea , former smoker,    breath sounds clear to auscultation       Cardiovascular negative cardio ROS   Rhythm:regular Rate:Normal     Neuro/Psych  Neuromuscular disease    GI/Hepatic GERD  ,  Endo/Other  diabetes, Type 2  Renal/GU      Musculoskeletal  (+) Arthritis ,   Abdominal   Peds  Hematology   Anesthesia Other Findings   Reproductive/Obstetrics                             Anesthesia Physical Anesthesia Plan  ASA: II  Anesthesia Plan: General   Post-op Pain Management:    Induction: Intravenous  PONV Risk Score and Plan: 2 and Ondansetron, Dexamethasone, Midazolam and Treatment may vary due to age or medical condition  Airway Management Planned: Oral ETT  Additional Equipment:   Intra-op Plan:   Post-operative Plan: Extubation in OR  Informed Consent: I have reviewed the patients History and Physical, chart, labs and discussed the procedure including the risks, benefits and alternatives for the proposed anesthesia with the patient or authorized representative who has indicated his/her understanding and acceptance.       Plan Discussed with: CRNA, Anesthesiologist and Surgeon  Anesthesia Plan Comments:         Anesthesia Quick Evaluation

## 2020-11-14 NOTE — Progress Notes (Signed)
Received call from Ben Arnold, MD. Probe seen posterior at L4-L5. Rolena Infante, MD notified of results.

## 2020-11-14 NOTE — Op Note (Signed)
Operative report  Preoperative diagnosis: Lumbar spinal stenosis L3-5 with bilateral neurogenic claudication and right-sided foot drop.  Postoperative diagnosis: Same  Operative procedure: Lumbar decompression L3-5 with lateral recess/medial facetectomy and foraminotomy.  First Assistant: Cleta Alberts, PA  Complications: None   EBL: 100 cc  Indications: Anthony Nash is a very pleasant 70 year old gentleman who presents with significant back buttock and bilateral neuropathic leg pain with a right foot drop.  Attempts at conservative management had failed to alleviate his pain.  As result of the neurological deficits, and the progressive pain and loss in quality of life we elected to move forward with surgery.  All appropriate risks benefits and alternatives to surgery were discussed and consent was obtained.  Operative report: Patient was brought the operating room placed upon the operating room table.  After successful induction of general anesthesia and endotracheal to intubation; teds, SCDs, and a Foley were inserted.  Patient was then turned prone onto the Wilson frame and all bony prominences were well-padded.  The back was prepped and draped in a standard fashion and a timeout was taken.  We confirmed patient, procedure, and all other important pertinent data.  2 needles were placed in the back and a x-ray was taken for localization of the skin incision.  The skin incision was marked and infiltrated with quarter percent Marcaine with epinephrine.  Midline incision was made and sharp dissection was carried out down to the deep fascia.  The deep fascia was exposed and I injected the paraspinal muscles with a combination of quarter percent Marcaine with epinephrine with Exparel.  This was to provide intraoperative hemostasis as well as postoperative analgesia.  The deep fascia was sharply incised and I stripped the paraspinal muscles to expose the posterior aspect of the L3-4 and L4-5 levels.  A  Penfield 4 was placed underneath the L4 lamina and the second x-ray was taken.  This was read by the radiologist and we confirmed that we are at the L4-5 level.  The posterior spinous process of L4 was removed with a double-action Leksell rongeur.  Using a Penfield 4, and curette I developed a plane underneath the lamina of L4 and performed a generous laminotomy of L4 with a 3 mm Kerrison rongeur.  I could now see the very thickened ligamentum flavum causing the central stenosis.  I gently dissected through this central raphae with a Penfield 4 until I could see the thecal sac.  I then continued to develop the plane between the thecal sac and the ligamentum flavum and then I resected the ligamentum flavum with a 3 mm Kerrison rongeur.  I then continued into the lateral recess performing a medial facetectomy with the 2 and 3 mm Kerrison rongeurs.  I carried my dissection out until I could palpate and visualize the medial border of the L5 pedicle.  I then identified the L5 nerve root protected and then continued inferiorly into the foramen with my Kerrison rongeur.  I also resected the leading edge of the L5 lamina in order to adequately decompress this region.  Once this was done both sides I felt as though the decompression at the 4 5 level was complete.  I then continued my decompression centrally superiorly.  I then resected the inferior third of the L3 spinous process, and using the same technique performed a central decompression with the Kerrison rongeurs.  Once I had removed an adequate amount of lamina I could dissect through the ligamentum flavum and then resect this to complete my central  decompression.  I then continued into the lateral recess with a 2 mm Kerrison rongeur removing the osteophyte from the medial aspect of the facet and ensuring that had an adequate lateral recess decompression.  There was a large epidural veins which were identified and coagulated with bipolar electrocautery.  At this  point I then passed a neuro patty underneath the remaining portion of the L4 lamina complete my central laminectomy of L4.  Once the central decompression was completed from L3-L5 I then proceeded into the lateral recess completing the lateral recess decompression with the Kerrison rongeurs.  I again went into the L4 foramen with my Kerrison rongeur to perform a foraminotomies at this level.  At this point I could freely pass my [facility] elevator superiorly underneath the L3 region and then out each of the foramen at L3, L4, and L5.  The lateral recess was also adequately decompressed.  Identified and coagulated epidural veins.  I confirmed that I had adequate decompression bilaterally.  With the decompression complete I irrigated the wound copiously with normal saline and confirmed hemostasis using bipolar cautery, and Floseal.  After final irrigation there was no evidence of CSF leak, and there is no active bleeding.  The retractors were removed, and the deep fascia was closed with interrupted #1 Vicryl sutures.  Superficial was closed with 2-0 Vicryl suture, and a 3-0 Monocryl for the skin.  Steri-Strips and dry dressings were applied and the patient was ultimately extubated transferred to the PACU without incident.  The end of the case all needle and sponge counts were correct.  There were no adverse intraoperative events.

## 2020-11-14 NOTE — Anesthesia Procedure Notes (Signed)
Procedure Name: Intubation Date/Time: 11/14/2020 7:36 AM Performed by: Hoy Morn, CRNA Pre-anesthesia Checklist: Patient identified, Emergency Drugs available, Suction available and Patient being monitored Patient Re-evaluated:Patient Re-evaluated prior to induction Oxygen Delivery Method: Circle system utilized Preoxygenation: Pre-oxygenation with 100% oxygen Induction Type: IV induction Ventilation: Mask ventilation without difficulty Laryngoscope Size: Miller and 2 Grade View: Grade I Tube type: Oral Tube size: 7.5 mm Number of attempts: 1 Airway Equipment and Method: Stylet and Oral airway Placement Confirmation: ETT inserted through vocal cords under direct vision,  positive ETCO2 and breath sounds checked- equal and bilateral Tube secured with: Tape Dental Injury: Teeth and Oropharynx as per pre-operative assessment

## 2020-11-15 ENCOUNTER — Inpatient Hospital Stay
Admission: AD | Admit: 2020-11-15 | Payer: TRICARE For Life (TFL) | Source: Ambulatory Visit | Admitting: Orthopedic Surgery

## 2020-11-15 ENCOUNTER — Emergency Department (HOSPITAL_COMMUNITY): Payer: Medicare Other | Admitting: Registered Nurse

## 2020-11-15 ENCOUNTER — Inpatient Hospital Stay (HOSPITAL_COMMUNITY): Payer: Medicare Other

## 2020-11-15 ENCOUNTER — Inpatient Hospital Stay (HOSPITAL_COMMUNITY)
Admission: EM | Admit: 2020-11-15 | Discharge: 2020-11-17 | DRG: 519 | Disposition: A | Discharge status: Home / Self Care | Payer: Medicare Other | Attending: Orthopedic Surgery | Admitting: Orthopedic Surgery

## 2020-11-15 ENCOUNTER — Encounter (HOSPITAL_COMMUNITY): Payer: Self-pay | Admitting: Orthopedic Surgery

## 2020-11-15 ENCOUNTER — Encounter (HOSPITAL_COMMUNITY)
Admission: EM | Disposition: A | Discharge status: Home / Self Care | Payer: Self-pay | Source: Home / Self Care | Attending: Orthopedic Surgery

## 2020-11-15 DIAGNOSIS — Z7984 Long term (current) use of oral hypoglycemic drugs: Secondary | ICD-10-CM

## 2020-11-15 DIAGNOSIS — Z79899 Other long term (current) drug therapy: Secondary | ICD-10-CM

## 2020-11-15 DIAGNOSIS — K219 Gastro-esophageal reflux disease without esophagitis: Secondary | ICD-10-CM | POA: Diagnosis present

## 2020-11-15 DIAGNOSIS — Z8546 Personal history of malignant neoplasm of prostate: Secondary | ICD-10-CM | POA: Diagnosis not present

## 2020-11-15 DIAGNOSIS — M4800 Spinal stenosis, site unspecified: Secondary | ICD-10-CM | POA: Diagnosis present

## 2020-11-15 DIAGNOSIS — M2578 Osteophyte, vertebrae: Secondary | ICD-10-CM | POA: Diagnosis present

## 2020-11-15 DIAGNOSIS — Z7901 Long term (current) use of anticoagulants: Secondary | ICD-10-CM

## 2020-11-15 DIAGNOSIS — G9761 Postprocedural hematoma of a nervous system organ or structure following a nervous system procedure: Secondary | ICD-10-CM | POA: Diagnosis not present

## 2020-11-15 DIAGNOSIS — M48061 Spinal stenosis, lumbar region without neurogenic claudication: Secondary | ICD-10-CM | POA: Diagnosis present

## 2020-11-15 DIAGNOSIS — Z96651 Presence of right artificial knee joint: Secondary | ICD-10-CM | POA: Diagnosis present

## 2020-11-15 DIAGNOSIS — M21371 Foot drop, right foot: Secondary | ICD-10-CM | POA: Diagnosis present

## 2020-11-15 DIAGNOSIS — Z791 Long term (current) use of non-steroidal anti-inflammatories (NSAID): Secondary | ICD-10-CM | POA: Diagnosis not present

## 2020-11-15 DIAGNOSIS — Y838 Other surgical procedures as the cause of abnormal reaction of the patient, or of later complication, without mention of misadventure at the time of the procedure: Secondary | ICD-10-CM | POA: Diagnosis not present

## 2020-11-15 DIAGNOSIS — Z20822 Contact with and (suspected) exposure to covid-19: Secondary | ICD-10-CM | POA: Diagnosis present

## 2020-11-15 DIAGNOSIS — G4733 Obstructive sleep apnea (adult) (pediatric): Secondary | ICD-10-CM | POA: Diagnosis present

## 2020-11-15 DIAGNOSIS — M48062 Spinal stenosis, lumbar region with neurogenic claudication: Secondary | ICD-10-CM | POA: Diagnosis present

## 2020-11-15 DIAGNOSIS — Z419 Encounter for procedure for purposes other than remedying health state, unspecified: Secondary | ICD-10-CM

## 2020-11-15 DIAGNOSIS — E119 Type 2 diabetes mellitus without complications: Secondary | ICD-10-CM | POA: Diagnosis present

## 2020-11-15 DIAGNOSIS — Z87891 Personal history of nicotine dependence: Secondary | ICD-10-CM

## 2020-11-15 HISTORY — PX: DECOMPRESSIVE LUMBAR LAMINECTOMY LEVEL 4: SHX5794

## 2020-11-15 HISTORY — PX: HEMATOMA EVACUATION: SHX5118

## 2020-11-15 LAB — GLUCOSE, CAPILLARY: Glucose-Capillary: 149 mg/dL — ABNORMAL HIGH (ref 70–99)

## 2020-11-15 LAB — POCT I-STAT, CHEM 8
BUN: 12 mg/dL (ref 8–23)
Calcium, Ion: 1.22 mmol/L (ref 1.15–1.40)
Chloride: 99 mmol/L (ref 98–111)
Creatinine, Ser: 0.8 mg/dL (ref 0.61–1.24)
Glucose, Bld: 135 mg/dL — ABNORMAL HIGH (ref 70–99)
HCT: 41 % (ref 39.0–52.0)
Hemoglobin: 13.9 g/dL (ref 13.0–17.0)
Potassium: 4.2 mmol/L (ref 3.5–5.1)
Sodium: 135 mmol/L (ref 135–145)
TCO2: 24 mmol/L (ref 22–32)

## 2020-11-15 LAB — SARS CORONAVIRUS 2 BY RT PCR (HOSPITAL ORDER, PERFORMED IN ~~LOC~~ HOSPITAL LAB): SARS Coronavirus 2: NEGATIVE

## 2020-11-15 SURGERY — DECOMPRESSIVE LUMBAR LAMINECTOMY LEVEL 4
Anesthesia: General | Site: Spine Lumbar

## 2020-11-15 MED ORDER — SODIUM CHLORIDE 0.9% FLUSH: 3.0000 mL | INTRAVENOUS | Status: DC | PRN

## 2020-11-15 MED ORDER — SUGAMMADEX SODIUM 200 MG/2ML IV SOLN
INTRAVENOUS | Status: DC | PRN
  Administered 2020-11-15: 200 mg via INTRAVENOUS

## 2020-11-15 MED ORDER — DOCUSATE SODIUM 100 MG PO CAPS
100.0000 mg | ORAL_CAPSULE | Freq: Two times a day (BID) | ORAL | Status: DC
  Administered 2020-11-16 – 2020-11-17 (×3): 100 mg via ORAL
  Filled 2020-11-15 (×3): qty 1

## 2020-11-15 MED ORDER — HEMOSTATIC AGENTS (NO CHARGE) OPTIME
TOPICAL | Status: DC | PRN
  Administered 2020-11-15: 1 via TOPICAL

## 2020-11-15 MED ORDER — FENTANYL CITRATE (PF) 250 MCG/5ML IJ SOLN
INTRAMUSCULAR | Status: AC
  Filled 2020-11-15: qty 5

## 2020-11-15 MED ORDER — HYDROMORPHONE HCL 1 MG/ML IJ SOLN
INTRAMUSCULAR | Status: AC
  Filled 2020-11-15: qty 0.5

## 2020-11-15 MED ORDER — MAGNESIUM CITRATE PO SOLN: 1.0000 | Freq: Once | ORAL | Status: DC | PRN

## 2020-11-15 MED ORDER — ROCURONIUM BROMIDE 10 MG/ML (PF) SYRINGE
PREFILLED_SYRINGE | INTRAVENOUS | Status: DC | PRN
  Administered 2020-11-15: 50 mg via INTRAVENOUS
  Administered 2020-11-15: 10 mg via INTRAVENOUS

## 2020-11-15 MED ORDER — OXYCODONE HCL 5 MG PO TABS
10.0000 mg | ORAL_TABLET | ORAL | Status: DC | PRN
Start: 2020-11-15 — End: 2020-11-17

## 2020-11-15 MED ORDER — CEFAZOLIN SODIUM-DEXTROSE 2-4 GM/100ML-% IV SOLN
INTRAVENOUS | Status: AC
  Filled 2020-11-15: qty 100

## 2020-11-15 MED ORDER — 0.9 % SODIUM CHLORIDE (POUR BTL) OPTIME
TOPICAL | Status: DC | PRN
  Administered 2020-11-15: 1000 mL

## 2020-11-15 MED ORDER — LIDOCAINE 2% (20 MG/ML) 5 ML SYRINGE
INTRAMUSCULAR | Status: DC | PRN
  Administered 2020-11-15: 60 mg via INTRAVENOUS

## 2020-11-15 MED ORDER — ACETAMINOPHEN 325 MG PO TABS
650.0000 mg | ORAL_TABLET | ORAL | Status: DC | PRN
  Administered 2020-11-16 – 2020-11-17 (×4): 650 mg via ORAL
  Filled 2020-11-15 (×4): qty 2

## 2020-11-15 MED ORDER — SUCCINYLCHOLINE CHLORIDE 200 MG/10ML IV SOSY
PREFILLED_SYRINGE | INTRAVENOUS | Status: DC | PRN
  Administered 2020-11-15: 120 mg via INTRAVENOUS

## 2020-11-15 MED ORDER — ONDANSETRON HCL 4 MG PO TABS: 4.0000 mg | ORAL_TABLET | Freq: Four times a day (QID) | ORAL | Status: DC | PRN

## 2020-11-15 MED ORDER — MENTHOL 3 MG MT LOZG: 1.0000 | LOZENGE | OROMUCOSAL | Status: DC | PRN

## 2020-11-15 MED ORDER — OXYCODONE HCL 5 MG PO TABS
5.0000 mg | ORAL_TABLET | ORAL | Status: DC | PRN
  Administered 2020-11-16 – 2020-11-17 (×5): 5 mg via ORAL
  Filled 2020-11-15 (×6): qty 1

## 2020-11-15 MED ORDER — FENTANYL CITRATE (PF) 250 MCG/5ML IJ SOLN
INTRAMUSCULAR | Status: DC | PRN
  Administered 2020-11-15 (×2): 50 ug via INTRAVENOUS
  Administered 2020-11-15: 100 ug via INTRAVENOUS
  Administered 2020-11-15: 50 ug via INTRAVENOUS

## 2020-11-15 MED ORDER — TRANEXAMIC ACID-NACL 1000-0.7 MG/100ML-% IV SOLN
INTRAVENOUS | Status: AC
  Filled 2020-11-15: qty 100

## 2020-11-15 MED ORDER — CEFAZOLIN SODIUM-DEXTROSE 2-3 GM-%(50ML) IV SOLR
INTRAVENOUS | Status: DC | PRN
  Administered 2020-11-15: 2 g via INTRAVENOUS

## 2020-11-15 MED ORDER — GABAPENTIN 300 MG PO CAPS
300.0000 mg | ORAL_CAPSULE | Freq: Three times a day (TID) | ORAL | Status: DC
  Administered 2020-11-16: 300 mg via ORAL
  Filled 2020-11-15: qty 1

## 2020-11-15 MED ORDER — HYDROMORPHONE HCL 1 MG/ML IJ SOLN
INTRAMUSCULAR | Status: DC | PRN
  Administered 2020-11-15 (×2): .5 mg via INTRAVENOUS

## 2020-11-15 MED ORDER — ACETAMINOPHEN 10 MG/ML IV SOLN
INTRAVENOUS | Status: DC | PRN
  Administered 2020-11-15: 1000 mg via INTRAVENOUS

## 2020-11-15 MED ORDER — METHOCARBAMOL 1000 MG/10ML IJ SOLN: 500.0000 mg | Freq: Four times a day (QID) | INTRAVENOUS | Status: DC | PRN

## 2020-11-15 MED ORDER — FENTANYL CITRATE (PF) 100 MCG/2ML IJ SOLN: 25.0000 ug | INTRAMUSCULAR | Status: DC | PRN

## 2020-11-15 MED ORDER — AMISULPRIDE (ANTIEMETIC) 5 MG/2ML IV SOLN: 10.0000 mg | Freq: Once | INTRAVENOUS | Status: DC | PRN

## 2020-11-15 MED ORDER — SODIUM CHLORIDE 0.9 % IV SOLN: 250.0000 mL | INTRAVENOUS | Status: DC

## 2020-11-15 MED ORDER — PROPOFOL 10 MG/ML IV BOLUS
INTRAVENOUS | Status: DC | PRN
  Administered 2020-11-15: 150 mg via INTRAVENOUS

## 2020-11-15 MED ORDER — PHENOL 1.4 % MT LIQD: 1.0000 | OROMUCOSAL | Status: DC | PRN

## 2020-11-15 MED ORDER — TRANEXAMIC ACID-NACL 1000-0.7 MG/100ML-% IV SOLN
INTRAVENOUS | Status: DC | PRN
  Administered 2020-11-15: 1000 mg via INTRAVENOUS

## 2020-11-15 MED ORDER — ACETAMINOPHEN 10 MG/ML IV SOLN
INTRAVENOUS | Status: AC
  Filled 2020-11-15: qty 100

## 2020-11-15 MED ORDER — LACTATED RINGERS IV SOLN: INTRAVENOUS | Status: DC | PRN

## 2020-11-15 MED ORDER — BUPIVACAINE-EPINEPHRINE (PF) 0.25% -1:200000 IJ SOLN
INTRAMUSCULAR | Status: DC | PRN
  Administered 2020-11-15: 30 mL via PERINEURAL

## 2020-11-15 MED ORDER — ACETAMINOPHEN 650 MG RE SUPP: 650.0000 mg | RECTAL | Status: DC | PRN

## 2020-11-15 MED ORDER — DEXAMETHASONE SODIUM PHOSPHATE 10 MG/ML IJ SOLN
INTRAMUSCULAR | Status: DC | PRN
  Administered 2020-11-15: 10 mg via INTRAVENOUS

## 2020-11-15 MED ORDER — ONDANSETRON HCL 4 MG/2ML IJ SOLN
INTRAMUSCULAR | Status: DC | PRN
  Administered 2020-11-15: 4 mg via INTRAVENOUS

## 2020-11-15 MED ORDER — ONDANSETRON HCL 4 MG/2ML IJ SOLN
4.0000 mg | Freq: Four times a day (QID) | INTRAMUSCULAR | Status: DC | PRN
  Administered 2020-11-16: 4 mg via INTRAVENOUS
  Filled 2020-11-15: qty 2

## 2020-11-15 MED ORDER — INSULIN ASPART 100 UNIT/ML ~~LOC~~ SOLN
0.0000 [IU] | Freq: Three times a day (TID) | SUBCUTANEOUS | Status: DC
  Administered 2020-11-16: 2 [IU] via SUBCUTANEOUS
  Administered 2020-11-16: 5 [IU] via SUBCUTANEOUS

## 2020-11-15 MED ORDER — METFORMIN HCL 500 MG PO TABS
500.0000 mg | ORAL_TABLET | Freq: Two times a day (BID) | ORAL | Status: DC
  Administered 2020-11-16: 500 mg via ORAL
  Filled 2020-11-15: qty 1

## 2020-11-15 MED ORDER — THROMBIN 20000 UNITS EX KIT
PACK | CUTANEOUS | Status: DC | PRN
  Administered 2020-11-15: 20000 [IU] via TOPICAL

## 2020-11-15 MED ORDER — INSULIN ASPART 100 UNIT/ML ~~LOC~~ SOLN: 0.0000 [IU] | Freq: Every day | SUBCUTANEOUS | Status: DC

## 2020-11-15 MED ORDER — PHENYLEPHRINE HCL-NACL 10-0.9 MG/250ML-% IV SOLN
INTRAVENOUS | Status: DC | PRN
  Administered 2020-11-15: 20 ug/min via INTRAVENOUS

## 2020-11-15 MED ORDER — PHENYLEPHRINE 40 MCG/ML (10ML) SYRINGE FOR IV PUSH (FOR BLOOD PRESSURE SUPPORT)
PREFILLED_SYRINGE | INTRAVENOUS | Status: DC | PRN
  Administered 2020-11-15 (×2): 120 ug via INTRAVENOUS
  Administered 2020-11-15: 80 ug via INTRAVENOUS
  Administered 2020-11-15: 120 ug via INTRAVENOUS

## 2020-11-15 MED ORDER — METHOCARBAMOL 500 MG PO TABS
500.0000 mg | ORAL_TABLET | Freq: Four times a day (QID) | ORAL | Status: DC | PRN
  Administered 2020-11-16 – 2020-11-17 (×6): 500 mg via ORAL
  Filled 2020-11-15 (×6): qty 1

## 2020-11-15 MED ORDER — CEFAZOLIN SODIUM-DEXTROSE 1-4 GM/50ML-% IV SOLN
1.0000 g | Freq: Three times a day (TID) | INTRAVENOUS | Status: DC
Start: 2020-11-16 — End: 2020-11-16
  Administered 2020-11-16: 1 g via INTRAVENOUS
  Filled 2020-11-15: qty 50

## 2020-11-15 MED ORDER — THROMBIN 20000 UNITS EX KIT
PACK | CUTANEOUS | Status: AC
  Filled 2020-11-15: qty 1

## 2020-11-15 MED ORDER — BUPIVACAINE-EPINEPHRINE (PF) 0.25% -1:200000 IJ SOLN
INTRAMUSCULAR | Status: AC
  Filled 2020-11-15: qty 30

## 2020-11-15 MED ORDER — MORPHINE SULFATE (PF) 2 MG/ML IV SOLN: 2.0000 mg | INTRAVENOUS | Status: DC | PRN

## 2020-11-15 MED ORDER — SODIUM CHLORIDE 0.9% FLUSH
3.0000 mL | Freq: Two times a day (BID) | INTRAVENOUS | Status: DC
  Administered 2020-11-16: 3 mL via INTRAVENOUS

## 2020-11-15 MED ORDER — POLYETHYLENE GLYCOL 3350 17 G PO PACK
17.0000 g | PACK | Freq: Every day | ORAL | Status: DC | PRN
Start: 2020-11-15 — End: 2020-11-17

## 2020-11-15 MED ORDER — LACTATED RINGERS IV SOLN: INTRAVENOUS | Status: DC

## 2020-11-15 MED FILL — Thrombin (Recombinant) For Soln 20000 Unit: CUTANEOUS | Qty: 1 | Status: AC

## 2020-11-15 SURGICAL SUPPLY — 63 items
BNDG GAUZE ELAST 4 BULKY (GAUZE/BANDAGES/DRESSINGS) ×3 IMPLANT
BUR EGG ELITE 4.0 (BURR) ×3 IMPLANT
BUR MATCHSTICK NEURO 3.0 LAGG (BURR) IMPLANT
CANISTER SUCT 3000ML PPV (MISCELLANEOUS) ×3 IMPLANT
CLSR STERI-STRIP ANTIMIC 1/2X4 (GAUZE/BANDAGES/DRESSINGS) ×3 IMPLANT
CORD BIPOLAR FORCEPS 12FT (ELECTRODE) ×3 IMPLANT
COVER SURGICAL LIGHT HANDLE (MISCELLANEOUS) ×3 IMPLANT
COVER WAND RF STERILE (DRAPES) ×3 IMPLANT
DRAIN CHANNEL 15F RND FF W/TCR (WOUND CARE) IMPLANT
DRAPE POUCH INSTRU U-SHP 10X18 (DRAPES) ×3 IMPLANT
DRAPE SURG 17X23 STRL (DRAPES) ×3 IMPLANT
DRAPE U-SHAPE 47X51 STRL (DRAPES) ×3 IMPLANT
DRSG OPSITE POSTOP 3X4 (GAUZE/BANDAGES/DRESSINGS) ×3 IMPLANT
DRSG OPSITE POSTOP 4X8 (GAUZE/BANDAGES/DRESSINGS) ×3 IMPLANT
DURAPREP 26ML APPLICATOR (WOUND CARE) ×3 IMPLANT
ELECT BLADE 4.0 EZ CLEAN MEGAD (MISCELLANEOUS)
ELECT CAUTERY BLADE 6.4 (BLADE) ×3 IMPLANT
ELECT PENCIL ROCKER SW 15FT (MISCELLANEOUS) ×3 IMPLANT
ELECT REM PT RETURN 9FT ADLT (ELECTROSURGICAL) ×3
ELECTRODE BLDE 4.0 EZ CLN MEGD (MISCELLANEOUS) IMPLANT
ELECTRODE REM PT RTRN 9FT ADLT (ELECTROSURGICAL) ×2 IMPLANT
EVACUATOR 1/8 PVC DRAIN (DRAIN) ×3 IMPLANT
EVACUATOR 3/16  PVC DRAIN (DRAIN) ×1
EVACUATOR 3/16 PVC DRAIN (DRAIN) ×2 IMPLANT
EVACUATOR SILICONE 100CC (DRAIN) IMPLANT
GLOVE BIO SURGEON STRL SZ 6.5 (GLOVE) ×3 IMPLANT
GLOVE BIOGEL PI IND STRL 8.5 (GLOVE) ×2 IMPLANT
GLOVE BIOGEL PI INDICATOR 8.5 (GLOVE) ×1
GLOVE SS BIOGEL STRL SZ 8.5 (GLOVE) ×4 IMPLANT
GLOVE SUPERSENSE BIOGEL SZ 8.5 (GLOVE) ×2
GLOVE SURG UNDER POLY LF SZ6.5 (GLOVE) ×3 IMPLANT
GOWN STRL REUS W/ TWL LRG LVL3 (GOWN DISPOSABLE) ×4 IMPLANT
GOWN STRL REUS W/TWL 2XL LVL3 (GOWN DISPOSABLE) ×3 IMPLANT
GOWN STRL REUS W/TWL LRG LVL3 (GOWN DISPOSABLE) ×2
KIT BASIN OR (CUSTOM PROCEDURE TRAY) ×3 IMPLANT
KIT TURNOVER KIT B (KITS) ×3 IMPLANT
NEEDLE 22X1 1/2 (OR ONLY) (NEEDLE) ×3 IMPLANT
NEEDLE SPNL 18GX3.5 QUINCKE PK (NEEDLE) ×6 IMPLANT
NS IRRIG 1000ML POUR BTL (IV SOLUTION) ×6 IMPLANT
PACK LAMINECTOMY ORTHO (CUSTOM PROCEDURE TRAY) ×3 IMPLANT
PACK UNIVERSAL I (CUSTOM PROCEDURE TRAY) ×3 IMPLANT
PAD ARMBOARD 7.5X6 YLW CONV (MISCELLANEOUS) ×6 IMPLANT
PATTIES SURGICAL .5 X.5 (GAUZE/BANDAGES/DRESSINGS) ×3 IMPLANT
PATTIES SURGICAL .5 X1 (DISPOSABLE) ×3 IMPLANT
SPONGE LAP 4X18 RFD (DISPOSABLE) ×3 IMPLANT
SPONGE SURGIFOAM ABS GEL 100 (HEMOSTASIS) ×3 IMPLANT
SURGIFLO W/THROMBIN 8M KIT (HEMOSTASIS) ×3 IMPLANT
SUT BONE WAX W31G (SUTURE) ×3 IMPLANT
SUT MNCRL AB 3-0 PS2 27 (SUTURE) ×3 IMPLANT
SUT SILK 0 FSL (SUTURE) ×3 IMPLANT
SUT SILK 3 0 SH 30 (SUTURE) ×3 IMPLANT
SUT VIC AB 0 CT1 27 (SUTURE)
SUT VIC AB 0 CT1 27XBRD ANBCTR (SUTURE) IMPLANT
SUT VIC AB 1 CT1 18XCR BRD 8 (SUTURE) ×6 IMPLANT
SUT VIC AB 1 CT1 8-18 (SUTURE) ×3
SUT VIC AB 2-0 CT1 18 (SUTURE) ×3 IMPLANT
SYR BULB IRRIG 60ML STRL (SYRINGE) ×3 IMPLANT
SYR CONTROL 10ML LL (SYRINGE) ×3 IMPLANT
TOWEL GREEN STERILE (TOWEL DISPOSABLE) ×3 IMPLANT
TOWEL GREEN STERILE FF (TOWEL DISPOSABLE) ×3 IMPLANT
TUBING BULK SUCTION (MISCELLANEOUS) ×3 IMPLANT
WATER STERILE IRR 1000ML POUR (IV SOLUTION) ×3 IMPLANT
YANKAUER SUCT BULB TIP NO VENT (SUCTIONS) ×6 IMPLANT

## 2020-11-15 NOTE — H&P (Signed)
Chief Complaint: Increasing low back pain and left lower extremity pain/weakness status post lumbar decompression.  History: Taryn is a very pleasant 70 year old gentleman who underwent a L3 L5 decompression on 11/14/20. He was discharged the day of surgery and I spoke to him that evening. The patient was ambulating with minimal lower extremity symptoms. He noted incisional back pain and some occasional dysesthesias but he was doing well overall. Patient states that even this morning he was walking doing well until approximately around 11 AM when he acutely started to have increased pain and difficulty walking. He contacted my office and was brought in for further evaluation.   An emergent MRI was performed noting that he had ongoing progressive stenosis at the L3 laminotomy site.  As result of the increasing pain and dysesthesias and weakness we elected to return to the OR to evacuate the hematoma as well as extend the decompression cephalad.   Past Medical History:  Diagnosis Date  . Arthritis   . Diabetes mellitus without complication ([place])   . GERD (gastroesophageal reflux disease)   . Inguinal hernia   . Neuropathy    per patient in the right foot  . Prostate cancer ([place]) 2018  . Sleep apnea    does not wear CPAP     No Known Allergies  No current facility-administered medications on file prior to encounter.   Current Outpatient Medications on File Prior to Encounter  Medication Sig Dispense Refill  . bismuth subsalicylate (PEPTO BISMOL) 262 MG/15ML suspension Take 30 mLs by mouth every 6 (six) hours as needed for indigestion or diarrhea or loose stools.    . Carboxymethylcellul-Glycerin (LUBRICATING EYE DROPS OP) Place 2-3 drops into both eyes daily as needed (dry eyes).    . metFORMIN (GLUCOPHAGE) 500 MG tablet Take 500 mg by mouth 2 (two) times daily.    . methocarbamol (ROBAXIN) 500 MG tablet Take 1 tablet (500 mg total) by mouth every 8 (eight) hours as needed for up to  5 days for muscle spasms. 15 tablet 0  . omeprazole (PRILOSEC) 40 MG capsule Take 40 mg by mouth at bedtime.    . ondansetron (ZOFRAN) 4 MG tablet Take 1 tablet (4 mg total) by mouth every 8 (eight) hours as needed for nausea or vomiting. 20 tablet 0  . oxyCODONE-acetaminophen (PERCOCET) 10-325 MG tablet Take 1 tablet by mouth every 6 (six) hours as needed for up to 5 days for pain. 20 tablet 0    Physical Exam: Patient is alert and oriented x3. No shortness of breath or chest pain.  Lungs are clear to auscultation bilaterally. Cardiac: Regular rate and rhythm.  No rubs gallops murmurs. Abdomen: Soft and nontender.  No rebound tenderness, no and continence of bowel and bladder.  He does describe urgency. Neurologic exam: Intact rectal tone with digital rectal exam.  Perianal sensation is intact to light touch and pinprick.  Lower extremity sensation to light touch and pinprick is intact bilaterally.  Patient has 3/5 left hip flexor and quad strength.  5/5 EHL and gastrocnemius strength.  Right lower extremity: 5/5 EHL/tibialis anterior/gastrocnemius strength.  4+/5 hip flexor and quad strength.  There is significant back pain with motor strength testing in the lower extremity especially with hip flexor and quad strength testing which may impact his overall clinical strength.  Negative Babinski test, no clonus, negative straight leg raise test.  Symmetrical but diminished deep tendon reflexes that are 1+ at the knee absent at the Achilles. Musculoskeletal: No  hip, knee, ankle pain with isolated joint range of motion. Compartments are soft and nontender bilaterally.  2+ dorsalis pedis/posterior tibialis pulses bilaterally.  Image: DG Chest 2 View  Result Date: 11/12/2020 CLINICAL DATA:  Preop lumbar spine surgery ex smoker EXAM: CHEST - 2 VIEW COMPARISON:  05/31/2018 FINDINGS: Surgical hardware in the cervical spine. No focal opacity or pleural effusion. Normal cardiomediastinal silhouette. No  pneumothorax. IMPRESSION: No active cardiopulmonary disease. Electronically Signed   By: Donavan Foil M.D.   On: 11/12/2020 21:48   DG Lumbar Spine 2-3 Views  Result Date: 11/14/2020 CLINICAL DATA:  Lumbar decompression EXAM: LUMBAR SPINE - 2-3 VIEW COMPARISON:  September 26, 2018 FINDINGS: Lateral lumbar image labeled image 1 demonstrates metallic probe tips posterior to the inferior aspect of L4 and L5-S1 levels. No fracture or spondylolisthesis. Lateral lumbar image labeled image 2 shows a probe posterior to the L4-5 interspace. No fracture or spondylolisthesis. Disc spaces appear unremarkable. IMPRESSION: On final submitted lateral image, metallic probe tip is posterior to the L4-5 level. These results were called by telephone at the time of interpretation on 11/14/2020 at 8:37 am to provider [facility] , who verbally acknowledged these results. Electronically Signed   By: Lowella Grip III M.D.   On: 11/14/2020 08:37   Lumbar MRI was completed at [facility] this evening.  This was done with contrast.  I have reviewed the images with the radiologist on-call.  There is some residual fluid consistent with a hematoma but no significant compression of the thecal sac.  There is residual slightly progressed stenosis at the L3 laminotomy site.  Stenosis extends to about the midportion of the L3 vertebral body.  No signs of infection, or CSF leak.  Decompression at L4-5 is adequate with improved space for the thecal sac.   A/P: Anthony Nash is a very pleasant 70 year old gentleman who underwent an L3-L5 5 decompression Thursday morning and was discharged later to home.  Thursday evening he was doing exceptionally well walking and having no significant pain.  Today at approximately 11 AM he started having increasing left lower extremity pain, and dysesthesias.  He is now no longer able to ambulate because of the pain and weakness.  As result he was seen in my office and a stat MRI was done.  I have  reviewed the MRI with the radiologist and we agree that the spinal stenosis at L3-4 may have progressed due to some buckling of residual ligamentum flavum.  There is some fluid consistent with a postoperative hematoma but no significant thecal sac compression.  No evidence of CSF leak.  At this point time as result of the left lower extremity dysesthesias and pain we have elected to move forward with a urgent lumbar decompression to evacuate the hematoma and extend the decompression cephalad.  I have gone over the surgical procedure with the patient in great detail including the risks, benefits, and alternatives to surgery.  These include: Infection, bleeding, death, stroke, paralysis, ongoing or worse pain, neurological deficits, need for additional urgent surgery, need for fusion surgery, CSF fluid leak, bleeding, and blood clots.  All of his questions and concerns were addressed.  Surgical plan: Revision lumbar decompression with extension of the L3 laminotomy.  Evacuation of hematoma.  We will place postoperative drains to prevent additional hematoma formation.  Patient will be admitted after surgery for pain control and physical therapy/rehab.

## 2020-11-15 NOTE — Anesthesia Procedure Notes (Signed)
Procedure Name: Intubation Date/Time: 11/15/2020 7:29 PM Performed by: Jearld Pies, CRNA Pre-anesthesia Checklist: Patient identified, Emergency Drugs available, Suction available and Patient being monitored Patient Re-evaluated:Patient Re-evaluated prior to induction Oxygen Delivery Method: Circle System Utilized Preoxygenation: Pre-oxygenation with 100% oxygen Induction Type: IV induction, Rapid sequence and Cricoid Pressure applied Laryngoscope Size: Glidescope and 4 Grade View: Grade I Tube type: Oral Tube size: 7.5 mm Number of attempts: 1 Airway Equipment and Method: Stylet and Video-laryngoscopy Placement Confirmation: ETT inserted through vocal cords under direct vision,  positive ETCO2 and breath sounds checked- equal and bilateral Secured at: 23 cm Tube secured with: Tape Dental Injury: Teeth and Oropharynx as per pre-operative assessment  Comments: Glidescope utilized d/t unknown COVID status

## 2020-11-15 NOTE — Brief Op Note (Signed)
11/15/2020  10:12 PM  PATIENT:  Anthony Nash  70 y.o. male  PRE-OPERATIVE DIAGNOSIS:  Lumbar hematoma  POST-OPERATIVE DIAGNOSIS:  Lumbar hematoma  PROCEDURE:  Procedure(s): EVACUATION OF LUMBAR HEMATOMA (N/A) REVISION L3 DECOMPRESSION L4 (N/A)  SURGEON:  Surgeon(s) and Role:    Melina Schools, MD - Primary  ASSISTANTS: none   ANESTHESIA:   general  EBL:  200 mL   BLOOD ADMINISTERED:none  DRAINS: 1 hemovac in the back   LOCAL MEDICATIONS USED:  NONE  SPECIMEN:  No Specimen  DISPOSITION OF SPECIMEN:  N/A  COUNTS:  YES  TOURNIQUET:  * No tourniquets in log *  DICTATION: .Dragon Dictation  PLAN OF CARE: Admit to inpatient   PATIENT DISPOSITION:  PACU - hemodynamically stable.

## 2020-11-15 NOTE — Op Note (Signed)
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  Epidural hematoma without evidence of cauda equina syndrome  Postoperative diagnosis: Same  Operative procedure: Revision L3 decompression with irrigation and evacuation of hematoma. , Complications: None  Estimated blood loss: 200 cc  Indications: Anthony Nash is a very pleasant 70 year old gentleman who had an L3 to L5 decompression Thursday morning and was later discharged home.  Patient was ambulating and doing quite well until this morning at approximately 11 AM.  He started having significant increasing low back pain and bilateral lower extremity leg pain with leg weakness affecting his ability to ambulate.  The patient presented to the office.  Repeat MRI was done.  That MRI demonstrated worsening stenosis at the L3 laminotomy site which could be contributing to his new symptoms.  There was postoperative fluid collection consistent with hematoma but there was no significant thecal sac compression.  Clinical exam also demonstrated no evidence of a cauda equina syndrome.  Because of the severe pain and neurological deficits we elected to take the patient back to the operating room for revision decompression.  All appropriate risks benefits and alternatives to surgery were discussed with the patient and consent was obtained.  Operative findings: Slight amount of hematoma was noted but no significant compression of the exposed thecal sac.  There was some residual stenosis and so the L3 laminotomy was extended cephalad until I could visualize the L3 pedicle itself.  Intraoperative x-ray confirmed that I was just at the inferior aspect of the L3 pedicle well cephalad to the area of stenosis seen on the MRI.  Operative report  Patient was brought the operating room placed upon the operating table.  After successful induction of general anesthesia and endotracheal intubation, teds SCDs and a Foley were  inserted.  The patient was turned prone onto the Wilson frame and all bony prominences were well-padded.  The back was then prepped and draped in a standard fashion.  Timeout was taken to confirm patient procedure and all other important data.  The previous incision was reincised and I sharply dissected down to the deep fascia.  The superficial 2-0 Vicryl sutures and 3-0 Monocryl stitch were removed as were the #1 Vicryl sutures.  I bluntly dissected through the deep fascia until I could visualize the thecal sac.  Self-retaining retractor was then placed and I irrigated the wound copiously normal saline.  There was no significant compression of the thecal sac but there was some hematoma noted.  This was evacuated.  Although is able to palpate superiorly with the [facility] elevator there was some infolding of the ligamentum flavum consistent with the MRI.  As result I used my 3 mm Kerrison rongeur to extend my laminotomy of L3 cephalad.  I remove the inferior two thirds of the L3 spinous process to aid in the decompression.  I continued until my laminotomy was adequate enough.  I then dissected through the thickened ligamentum flavum and used my Kerrison rongeur to resect this.  I then used a Penfield 4 to gently dissect into the lateral recess and I remove the ligamentum flavum in the lateral recess.  There was epidural veins that were identified and coagulated with bipolar cautery.  At this point both the left and right sides were decompressed into the lateral recess.  I then placed my [facility] into the lateral recess and placed it where my decompression had stopped.  An x-ray was taken and I confirmed that the [facility] was just  at the inferior aspect of the L3 pedicle confirming that I was well above the area of maximal recurrent stenosis seen on the preoperative MRI.  I could also visualize the L3 pedicle and palpate out the L3 neuroforamen.  At this point I irrigated the wound copiously normal  saline and then checked 1 last time the entire length of the decompression from L3 down to S1 to confirm the lateral recess and the foramen more adequately decompressed.  I then placed Floseal and administered IV TXA to aid in postoperative hemostasis.  Postop a deep drain was placed and taken out through a separate stab incision site.  The wound was then copiously irrigated with normal saline.  And I confirmed that we had hemostasis.  A Valsalva to 40 mmHg was held for 10 seconds and there is no evidence of CSF leak.  I then removed the retractor and closed the deep fascia with interrupted #1 Vicryl sutures.  I then placed a layer of 0 Vicryl suture, then 2-0 interrupted Vicryl suture, and then 3-0 Monocryl for the skin.  The drain was secured with a #1 silk dressing to prevent inadvertent removal.  A bulky dry dressing was applied, and the patient was ultimately extubated and transferred the PACU without incident.  The end of the case all needle and sponge counts were correct.  There were no adverse intraoperative events.

## 2020-11-15 NOTE — Anesthesia Preprocedure Evaluation (Signed)
Anesthesia Evaluation  Patient identified by MRN, date of birth, ID band Patient awake    Reviewed: Allergy & Precautions, NPO status , Patient's Chart, lab work & pertinent test results  Airway Mallampati: II  TM Distance: >3 FB     Dental  (+) Dental Advisory Given   Pulmonary sleep apnea , former smoker,    breath sounds clear to auscultation       Cardiovascular negative cardio ROS   Rhythm:Regular Rate:Normal     Neuro/Psych Lumbar hematoma  Neuromuscular disease    GI/Hepatic Neg liver ROS, GERD  Medicated,  Endo/Other  diabetes, Type 2  Renal/GU negative Renal ROS     Musculoskeletal  (+) Arthritis ,   Abdominal   Peds  Hematology negative hematology ROS (+)   Anesthesia Other Findings   Reproductive/Obstetrics                             Anesthesia Physical Anesthesia Plan  ASA: III and emergent  Anesthesia Plan: General   Post-op Pain Management:    Induction: Intravenous and Rapid sequence  PONV Risk Score and Plan: 2 and Ondansetron, Dexamethasone and Treatment may vary due to age or medical condition  Airway Management Planned: Oral ETT and Video Laryngoscope Planned  Additional Equipment:   Intra-op Plan:   Post-operative Plan: Extubation in OR  Informed Consent: I have reviewed the patients History and Physical, chart, labs and discussed the procedure including the risks, benefits and alternatives for the proposed anesthesia with the patient or authorized representative who has indicated his/her understanding and acceptance.     Dental advisory given  Plan Discussed with: CRNA  Anesthesia Plan Comments:         Anesthesia Quick Evaluation

## 2020-11-15 NOTE — Progress Notes (Signed)
Patient arrived to short stay for emergent surgery this evening. Dr. Rolena Infante at bedside requesting patient be taken back as soon as possible. IV placed by CRNA. Consent signed and witnessed. Patient taken back to OR at this time.

## 2020-11-15 NOTE — Transfer of Care (Signed)
Immediate Anesthesia Transfer of Care Note  Patient: Anthony Nash  Procedure(s) Performed: EVACUATION OF LUMBAR HEMATOMA (N/A Spine Lumbar) REVISION L3 DECOMPRESSION L4 (N/A )  Patient Location: PACU  Anesthesia Type:General  Level of Consciousness: drowsy, patient cooperative and responds to stimulation  Airway & Oxygen Therapy: Patient Spontanous Breathing and Patient connected to nasal cannula oxygen  Post-op Assessment: Report given to RN and Post -op Vital signs reviewed and stable  Post vital signs: Reviewed and stable  Last Vitals:  Vitals Value Taken Time  BP 129/81   Temp    Pulse 88   Resp 16   SpO2 98     Last Pain: There were no vitals filed for this visit.       Complications: No complications documented.

## 2020-11-16 LAB — HEMOGLOBIN A1C
Hgb A1c MFr Bld: 6.7 % — ABNORMAL HIGH (ref 4.8–5.6)
Mean Plasma Glucose: 145.59 mg/dL

## 2020-11-16 LAB — GLUCOSE, CAPILLARY
Glucose-Capillary: 222 mg/dL — ABNORMAL HIGH (ref 70–99)
Glucose-Capillary: 116 mg/dL — ABNORMAL HIGH (ref 70–99)
Glucose-Capillary: 125 mg/dL — ABNORMAL HIGH (ref 70–99)
Glucose-Capillary: 100 mg/dL — ABNORMAL HIGH (ref 70–99)

## 2020-11-16 MED ORDER — METFORMIN HCL 500 MG PO TABS
1000.0000 mg | ORAL_TABLET | Freq: Every day | ORAL | Status: DC
  Administered 2020-11-17: 1000 mg via ORAL
  Filled 2020-11-16: qty 2

## 2020-11-16 MED ORDER — CEFAZOLIN SODIUM-DEXTROSE 1-4 GM/50ML-% IV SOLN
1.0000 g | Freq: Three times a day (TID) | INTRAVENOUS | Status: DC
  Administered 2020-11-16: 1 g via INTRAVENOUS
  Filled 2020-11-16: qty 50

## 2020-11-16 NOTE — Discharge Instructions (Signed)

## 2020-11-16 NOTE — Progress Notes (Signed)
    Subjective: Procedure(s) (LRB): EVACUATION OF LUMBAR HEMATOMA (N/A) REVISION L3 DECOMPRESSION L4 (N/A) 1 Day Post-Op  Patient reports pain as 2 on 0-10 scale.  Reports decreased leg pain reports incisional back pain   Positive void Negative bowel movement Positive flatus Negative chest pain or shortness of breath  Objective: Vital signs in last 24 hours: Temp:  [97.5 F (36.4 C)-99.4 F (37.4 C)] 99.4 F (37.4 C) (01/22 0720) Pulse Rate:  [66-85] 67 (01/22 0720) Resp:  [13-23] 18 (01/22 0720) BP: (105-129)/(64-86) 120/64 (01/22 0720) SpO2:  [94 %-98 %] 94 % (01/22 0720)  Intake/Output from previous day: 01/21 0701 - 01/22 0700 In: 2100 [I.V.:1800; IV Piggyback:300] Out: 2335 [Urine:2075; Drains:60; Blood:200]  Labs: Recent Labs    11/15/20 1955 11/15/20 2016  HCT 41.0 41.0   Recent Labs    11/15/20 1955 11/15/20 2016  NA 132* 135  K 7.4* 4.2  CL 101 99  BUN 18 12  CREATININE 0.80 0.80  GLUCOSE 139* 135*   No results for input(s): LABPT, INR in the last 72 hours.  Physical Exam: ABD soft Intact pulses distally Incision: dressing C/D/I Compartment soft 5/5 quad/hamstring/hip flexor strength.  4/5 left EHL/TA.  5/5 right EHL/TA There is no height or weight on file to calculate BMI.   Assessment/Plan: Patient stable  xrays n/a Continue mobilization with physical therapy Continue care  Advance diet Up with therapy  Doing well with therapy.  Ambulating without difficulty in hall Drain output 60 cc.  Will d/c in AM.  Continue iv abx until drain removed Plan on d/c to home tomorrow if cleared by PT  Melina Schools, MD [facility] [phone],

## 2020-11-16 NOTE — Evaluation (Signed)
Occupational Therapy Evaluation Patient Details Name: Anthony Nash MRN: [id number] DOB: 11/30/1950 Today's Date: 11/16/2020    History of Present Illness 69 year old gentleman who underwent a L3 L5 decompression on 11/14/20.  Returned to the ER to have hematoma evacuated as well as decompression was extended.   Clinical Impression   Patient admitted for the procedure above.  He is progressing nicely, and receptive to all education.  Patient did not have any rehab after his initial decompression surgery.  He had a lot of good questions, and verbalized understanding of all education.  MD wants him to stay one more day due to drain, OT to follow as needed, no post acute OT needed.  PTA he was independent with all care and mobility.  Currently he is at a supervision level.      Follow Up Recommendations  No OT follow up    Equipment Recommendations  Tub/shower seat    Recommendations for Other Services       Precautions / Restrictions Precautions Precautions: Back Required Braces or Orthoses: Spinal Brace Spinal Brace: Lumbar corset;Applied in sitting position Restrictions Weight Bearing Restrictions: No      Mobility Bed Mobility Overal bed mobility: Modified Independent                  Transfers Overall transfer level: Needs assistance   Transfers: Sit to/from Stand Sit to Stand: Supervision         General transfer comment: patient pulling from the RW to stand    Balance Overall balance assessment: Needs assistance Sitting-balance support: Feet supported Sitting balance-Leahy Scale: Good     Standing balance support: Bilateral upper extremity supported Standing balance-Leahy Scale: Poor Standing balance comment: RW needed                           ADL either performed or assessed with clinical judgement   ADL Overall ADL's : Needs assistance/impaired Eating/Feeding: Independent   Grooming: Wash/dry hands;Wash/dry face;Oral  care;Supervision/safety;Standing   Upper Body Bathing: Set up;Sitting   Lower Body Bathing: Supervison/ safety;Sit to/from stand   Upper Body Dressing : Set up;Sitting   Lower Body Dressing: Supervision/safety;Sit to/from stand   Toilet Transfer: Supervision/safety;Ambulation;RW   Toileting- Clothing Manipulation and Hygiene: Independent;Sit to/from stand       Functional mobility during ADLs: Supervision/safety;Rolling walker       Vision Patient Visual Report: No change from baseline       Perception     Praxis      Pertinent Vitals/Pain Pain Assessment: Faces Faces Pain Scale: Hurts a little bit Pain Location: low back and R leg Pain Descriptors / Indicators: Aching;Tender Pain Intervention(s): Monitored during session     Hand Dominance Right   Extremity/Trunk Assessment Upper Extremity Assessment Upper Extremity Assessment: Overall WFL for tasks assessed   Lower Extremity Assessment Lower Extremity Assessment: Defer to PT evaluation   Cervical / Trunk Assessment Cervical / Trunk Assessment: Normal   Communication Communication Communication: No difficulties   Cognition Arousal/Alertness: Awake/alert Behavior During Therapy: WFL for tasks assessed/performed Overall Cognitive Status: Within Functional Limits for tasks assessed                                     General Comments       Exercises     Shoulder Instructions      Home Living Family/patient expects to be  discharged to:: Private residence   Available Help at Discharge: Family;Available 24 hours/day Type of Home: House Home Access: Stairs to enter [employer] of Steps: 3 from the garage, 5 to landing and 14 to upstairs.   Home Layout: Multi-level   Alternate Level Stairs-Rails: Left Bathroom Shower/Tub: Tub/shower unit   Bathroom Toilet: Standard Bathroom Accessibility: Yes How Accessible: Accessible via walker Home Equipment: [place] - 2  wheels;Bedside commode          Prior Functioning/Environment Level of Independence: Independent                 OT Problem List: Pain;Impaired balance (sitting and/or standing)      OT Treatment/Interventions: Self-care/ADL training;Therapeutic activities    OT Goals(Current goals can be found in the care plan section) Acute Rehab OT Goals Patient Stated Goal: Return home OT Goal Formulation: With patient Time For Goal Achievement: 11/30/20 Potential to Achieve Goals: Good ADL Goals Pt Will Perform Lower Body Bathing: Independently Pt Will Perform Lower Body Dressing: Independently Pt Will Transfer to Toilet: Independently Pt Will Perform Toileting - Clothing Manipulation and hygiene: Independently  OT Frequency: Min 2X/week   Barriers to D/C:    none noted       Co-evaluation              AM-PAC OT "6 Clicks" Daily Activity     Outcome Measure Help from another person eating meals?: None Help from another person taking care of personal grooming?: None Help from another person toileting, which includes using toliet, bedpan, or urinal?: A Little Help from another person bathing (including washing, rinsing, drying)?: A Little Help from another person to put on and taking off regular upper body clothing?: None Help from another person to put on and taking off regular lower body clothing?: A Little 6 Click Score: 21   End of Session Equipment Utilized During Treatment: Rolling walker Nurse Communication: Mobility status  Activity Tolerance: Patient tolerated treatment well Patient left: in chair;with call bell/phone within reach  OT Visit Diagnosis: Unsteadiness on feet (R26.81);Pain Pain - Right/Left: Right Pain - part of body: Leg                Time: 7858-8502 OT Time Calculation (min): 20 min Charges:  OT General Charges $OT Visit: 1 Visit OT Evaluation $OT Eval Moderate Complexity: 1 Mod  11/16/2020  Rich, OTR/L  Acute Rehabilitation  Services  Office:  [phone]   Metta Clines 11/16/2020, 8:39 AM

## 2020-11-16 NOTE — Evaluation (Signed)
Physical Therapy Evaluation Patient Details Name: Anthony Nash MRN: [id number] DOB: [birth year] Today's Date: 11/16/2020   History of Present Illness  70 year old gentleman who underwent a L3 L5 decompression on 11/14/20.  Returned to the ER to have hematoma evacuated as well as decompression was extended on 1/21.  Clinical Impression  Patient overall supervision for OOB mobility with RW. Patient was independent prior to initial surgery. Patient ambulated 300' with RW and negotiated 10 stairs with L handrail. Verbally reviewed back precautions with patient able to maintain throughout session. Patient presents with decreased activity tolerance, impaired balance, generalized weakness (L>R). Patient will benefit from skilled PT services during acute stay to address listed deficits. No PT follow up recommended at this time.      Follow Up Recommendations No PT follow up    Equipment Recommendations  None recommended by PT    Recommendations for Other Services       Precautions / Restrictions Precautions Precautions: Back Precaution Comments: verbally reviewed precautions Required Braces or Orthoses: Spinal Brace Spinal Brace: Lumbar corset;Applied in sitting position Restrictions Weight Bearing Restrictions: No      Mobility  Bed Mobility Overal bed mobility: Modified Independent                  Transfers Overall transfer level: Needs assistance Equipment used: Rolling walker (2 wheeled) Transfers: Sit to/from Stand Sit to Stand: Supervision         General transfer comment: patient pulling from the RW to stand  Ambulation/Gait Ambulation/Gait assistance: Supervision Gait Distance (Feet): 300 Feet Assistive device: Rolling walker (2 wheeled) Gait Pattern/deviations: Step-through pattern;Decreased stride length Gait velocity: decreased   General Gait Details: slow steady pace, no LOB noted  Stairs Stairs: Yes Stairs assistance: Supervision Stair Management:  One rail Left;Step to pattern;Forwards Number of Stairs: 10    Wheelchair Mobility    Modified Rankin (Stroke Patients Only)       Balance Overall balance assessment: Needs assistance Sitting-balance support: Feet supported Sitting balance-Leahy Scale: Good     Standing balance support: Bilateral upper extremity supported Standing balance-Leahy Scale: Poor Standing balance comment: reliant on UE support                             Pertinent Vitals/Pain Pain Assessment: Faces Faces Pain Scale: Hurts a little bit Pain Location: low back and R leg Pain Descriptors / Indicators: Aching;Tender Pain Intervention(s): Monitored during session    Home Living Family/patient expects to be discharged to:: Private residence Living Arrangements: Spouse/significant other Available Help at Discharge: Family;Available 24 hours/day Type of Home: House Home Access: Stairs to enter   [employer] of Steps: 3 from the garage, 5 to landing and 14 to upstairs. Home Layout: Multi-level Home Equipment: Walker - 2 wheels;Bedside commode      Prior Function Level of Independence: Independent               Hand Dominance   Dominant Hand: Right    Extremity/Trunk Assessment   Upper Extremity Assessment Upper Extremity Assessment: Defer to OT evaluation    Lower Extremity Assessment Lower Extremity Assessment: Generalized weakness (L>R)    Cervical / Trunk Assessment Cervical / Trunk Assessment: Normal  Communication   Communication: No difficulties  Cognition Arousal/Alertness: Awake/alert Behavior During Therapy: WFL for tasks assessed/performed Overall Cognitive Status: Within Functional Limits for tasks assessed  General Comments      Exercises     Assessment/Plan    PT Assessment Patient needs continued PT services  PT Problem List Decreased strength;Decreased activity  tolerance;Decreased mobility;Decreased balance       PT Treatment Interventions DME instruction;Gait training;Stair training;Functional mobility training;Therapeutic activities;Therapeutic exercise;Balance training;Patient/family education    PT Goals (Current goals can be found in the Care Plan section)  Acute Rehab PT Goals Patient Stated Goal: Return home PT Goal Formulation: With patient Time For Goal Achievement: 11/23/20 Potential to Achieve Goals: Good    Frequency Min 5X/week   Barriers to discharge        Co-evaluation               AM-PAC PT "6 Clicks" Mobility  Outcome Measure Help needed turning from your back to your side while in a flat bed without using bedrails?: None Help needed moving from lying on your back to sitting on the side of a flat bed without using bedrails?: None Help needed moving to and from a bed to a chair (including a wheelchair)?: A Little Help needed standing up from a chair using your arms (e.g., wheelchair or bedside chair)?: A Little Help needed to walk in hospital room?: A Little Help needed climbing 3-5 steps with a railing? : A Little 6 Click Score: 20    End of Session Equipment Utilized During Treatment: Back brace Activity Tolerance: Patient tolerated treatment well Patient left: in bed;with call bell/phone within reach Nurse Communication: Mobility status PT Visit Diagnosis: Unsteadiness on feet (R26.81);Muscle weakness (generalized) (M62.81)    Time: 8657-8469 PT Time Calculation (min) (ACUTE ONLY): 30 min   Charges:   PT Evaluation $PT Eval Low Complexity: 1 Low PT Treatments $Therapeutic Activity: 8-22 mins        Alayna A. Gilford Rile PT, DPT Acute Rehabilitation Services Pager [phone] Office [phone]   Alda Lea 11/16/2020, 10:56 AM

## 2020-11-17 LAB — GLUCOSE, CAPILLARY
Glucose-Capillary: 101 mg/dL — ABNORMAL HIGH (ref 70–99)
Glucose-Capillary: 71 mg/dL (ref 70–99)

## 2020-11-17 NOTE — Progress Notes (Signed)
Occupational Therapy Treatment Patient Details Name: Isaak Delmundo MRN: [id number] DOB: 1950/11/23 Today's Date: 11/17/2020    History of present illness 70 year old gentleman who underwent a L3 L5 decompression on 11/14/20.  Returned to the ER to have hematoma evacuated as well as decompression was extended on 1/21.   OT comments  Pt. Seen for skilled OT treatment session. Pt. Able to demo safe bed mobility and short distance ambulation to/from b.room for simulated toileting task.  Cues for safety with sit/stand and rw use.  Spoke with PT assigned to pt. And they will also review safe sit/stand with rw during their session.   Anticipate d/c home soon.    Follow Up Recommendations  No OT follow up    Equipment Recommendations       Recommendations for Other Services      Precautions / Restrictions Precautions Precautions: Back Precaution Comments: verbally reviewed precautions Required Braces or Orthoses: Spinal Brace Spinal Brace: Lumbar corset;Applied in sitting position;Other (comment) (pt. states he has been ambulating to/from b.room without brace since it is short distance) Spinal Brace Comments: pt. states he has been ambulating to/from b.room without brace since it is short distance       Mobility Bed Mobility Overal bed mobility: Modified Independent             General bed mobility comments: hob flat, no use of rails, exited on R side but reports he will be on L side at home  Transfers Overall transfer level: Needs assistance Equipment used: Rolling walker (2 wheeled) Transfers: Sit to/from [facility] Sit to Stand: Supervision Stand pivot transfers: Supervision       General transfer comment: educated on safe rw positioning and management when transitioning from sit/stand.  pt. wanting to have rw on left side and use l hand on rw and r hand on bed rail. uged pt. not to use this tech. as it would increase fall risk.  reivewed with PT also that  will be working with him today to go over a safer tech. for sit/stand    Balance                                           ADL either performed or assessed with clinical judgement   ADL Overall ADL's : Needs assistance/impaired                     Lower Body Dressing: Supervision/safety;Sitting/lateral leans;Adhering to back precautions Lower Body Dressing Details (indicate cue type and reason): able to reach b les without breaking precautions Toilet Transfer: Supervision/safety;Ambulation;RW   Toileting- Clothing Manipulation and Hygiene: Independent;Sit to/from stand       Functional mobility during ADLs: Supervision/safety;Rolling walker General ADL Comments: cues for rw management. educated on safe pieces of furniture to use if he needs to to hold onto     Vision       Perception     Praxis      Cognition Arousal/Alertness: Awake/alert Behavior During Therapy: WFL for tasks assessed/performed Overall Cognitive Status: Within Functional Limits for tasks assessed                                          Exercises     Shoulder Instructions  General Comments  pt. Is avid golfer and eager to return to golfing when able.     Pertinent Vitals/ Pain       Pain Assessment: Faces Faces Pain Scale: Hurts little more Pain Location: low back and R leg Pain Descriptors / Indicators: Aching;Tender Pain Intervention(s): Limited activity within patient's tolerance;Repositioned  Home Living                                          Prior Functioning/Environment              Frequency  Min 2X/week        Progress Toward Goals  OT Goals(current goals can now be found in the care plan section)  Progress towards OT goals: Progressing toward goals     Plan Discharge plan remains appropriate    Co-evaluation                 AM-PAC OT "6 Clicks" Daily Activity     Outcome Measure    Help from another person eating meals?: None Help from another person taking care of personal grooming?: None Help from another person toileting, which includes using toliet, bedpan, or urinal?: A Little Help from another person bathing (including washing, rinsing, drying)?: A Little Help from another person to put on and taking off regular upper body clothing?: None Help from another person to put on and taking off regular lower body clothing?: A Little 6 Click Score: 21    End of Session Equipment Utilized During Treatment: Rolling walker  OT Visit Diagnosis: Unsteadiness on feet (R26.81);Pain Pain - Right/Left: Right   Activity Tolerance Patient tolerated treatment well   Patient Left in bed;with call bell/phone within reach   Nurse Communication Other (comment) (reviewed with RN pt. wanted assistance ordering his b.fast)        Time: 0800-0821 OT Time Calculation (min): 21 min  Charges: OT General Charges $OT Visit: 1 Visit OT Treatments $Self Care/Home Management : 8-22 mins  Sonia Baller, COTA/L Acute Rehabilitation [phone]   Janice Coffin 11/17/2020, 9:05 AM

## 2020-11-17 NOTE — Progress Notes (Signed)
Subjective: 2 Days Post-Op Procedure(s) (LRB): EVACUATION OF LUMBAR HEMATOMA (N/A) REVISION L3 DECOMPRESSION L4 (N/A) Patient reports pain as mild.    Objective: Vital signs in last 24 hours: Temp:  [98.2 F (36.8 C)-99.4 F (37.4 C)] 98.4 F (36.9 C) (01/23 0828) Pulse Rate:  [67-86] 67 (01/23 0828) Resp:  [18-20] 20 (01/23 0828) BP: (111-122)/(61-82) 111/69 (01/23 0828) SpO2:  [94 %-100 %] 96 % (01/23 0828)  Intake/Output from previous day: 01/22 0701 - 01/23 0700 In: -  Out: 55 [Drains:55] Intake/Output this shift: No intake/output data recorded.  Recent Labs    11/15/20 1955 11/15/20 2016  HGB 13.9 13.9   Recent Labs    11/15/20 1955 11/15/20 2016  HCT 41.0 41.0   Recent Labs    11/15/20 1955 11/15/20 2016  NA 132* 135  K 7.4* 4.2  CL 101 99  BUN 18 12  CREATININE 0.80 0.80  GLUCOSE 139* 135*   No results for input(s): LABPT, INR in the last 72 hours.  Neurologically intact Incision: dressing C/D/I No cellulitis present   Assessment/Plan: 2 Days Post-Op Procedure(s) (LRB): EVACUATION OF LUMBAR HEMATOMA (N/A) REVISION L3 DECOMPRESSION L4 (N/A) Up with therapy  Discharge home    Anthony Nash 11/17/2020, 9:01 AM

## 2020-11-17 NOTE — Progress Notes (Signed)
Patient alert and oriented, voiding adequately with no c/o pain at this time. Patient discharged home per order. Patient and spouse stated understanding of instructions given. Patient was taking down with all belongings. Patient has an appointment with MD in 2 weeks

## 2020-11-17 NOTE — Progress Notes (Signed)
Physical Therapy Treatment Patient Details Name: Anthony Nash MRN: [id number] DOB: 06-Dec-1950 Today's Date: 11/17/2020    History of Present Illness 70 year old gentleman who underwent a L3 L5 decompression on 11/14/20.  Returned to the ER to have hematoma evacuated as well as decompression was extended on 1/21.    PT Comments    Pt demonstrates good understanding of his spinal precautions, but did require a couple cues for log rolling and dropping legs off bed while trunk ascends for bed mobility. Provided and reviewed spinal precaution handout and answered all questions. Pt tends to pull up on RW to come to stand from simulated home set-up low bed height, despite multiple attempts at alternative techniques, see Transfers below. Pt able to ambulate and navigate stairs safely with UE support, demonstrating learning of techniques from previous session. However, his L foot does get fatigued with gait, resulting in L foot drag placing him at risk for tripping and falls. Educated pt to be aware of changes in levels (carpets) in home to avoid falls. Will continue to follow acutely. Current recommendations remain appropriate.  Follow Up Recommendations  No PT follow up     Equipment Recommendations  None recommended by PT    Recommendations for Other Services       Precautions / Restrictions Precautions Precautions: Back Precaution Booklet Issued: Yes (comment) Precaution Comments: Provided handout and reviewed, pt able to recall 3/3 spinal precautions without cues Required Braces or Orthoses: Spinal Brace Spinal Brace: Lumbar corset;Applied in sitting position Spinal Brace Comments: pt. states he has been ambulating to/from b.room without brace since it is short distance Restrictions Weight Bearing Restrictions: No    Mobility  Bed Mobility Overal bed mobility: Modified Independent             General bed mobility comments: Bed flat, no use of rails, reviewing need to log roll as  he initially began to bring legs laterally to EOB. Pt able to log roll and sit up from sideyling safely. Cued to push up from bed as legs dropped off bed for safety/ease, success on 2nd rep.  Transfers Overall transfer level: Needs assistance Equipment used: Rolling walker (2 wheeled) Transfers: Sit to/from Stand Sit to Stand: Min guard Stand pivot transfers: Supervision       General transfer comment: Pt initially pulling up on RW from low bed position (simulated to home). Cued pt to attempt to push up from bed and then transition 1 hand at a time to RW, but pt repeatedly not performing slight anterior lean or gaining momentum to attempt stand but rather minimally attempts with min buttocks clearance before he ceases attempts. Pt became agitated in attempts to provide safer alternative to choose from, including having polded pillows under hands to push up on to inc ease and to put one hand on RW and one on bed. Educated and reassured pt that at the end of the day it is his choice which technique he chooses but was just trying to provide him with a set of tools to choose from. Educated pt on risks of pulling up on RW as it could result in posterior LOB and compression on spine, but pt preferred this option.  Ambulation/Gait Ambulation/Gait assistance: Supervision Gait Distance (Feet): 250 Feet Assistive device: Rolling walker (2 wheeled) Gait Pattern/deviations: Step-through pattern;Decreased stride length;Decreased dorsiflexion - left Gait velocity: decreased Gait velocity interpretation: <1.8 ft/sec, indicate of risk for recurrent falls General Gait Details: slow steady pace, no LOB noted. Increased L foot drag with  distance.   Stairs Stairs: Yes Stairs assistance: Supervision Stair Management: One rail Left;Step to pattern;Forwards;One rail Right Number of Stairs: 10 General stair comments: Ascending and descending 10 stairs with L hand rail up, R down to simulate home. Reviewed  leading up with strong leg and down with weak leg, success. No LOB.   Wheelchair Mobility    Modified Rankin (Stroke Patients Only)       Balance Overall balance assessment: Needs assistance Sitting-balance support: Feet supported Sitting balance-Leahy Scale: Good     Standing balance support: Bilateral upper extremity supported Standing balance-Leahy Scale: Poor Standing balance comment: reliant on UE support                            Cognition Arousal/Alertness: Awake/alert Behavior During Therapy: WFL for tasks assessed/performed Overall Cognitive Status: Within Functional Limits for tasks assessed                                        Exercises      General Comments        Pertinent Vitals/Pain Pain Assessment: Faces Faces Pain Scale: Hurts little more Pain Location: lower back with transfers Pain Descriptors / Indicators: Aching;Tender;Sharp Pain Intervention(s): Limited activity within patient's tolerance;Monitored during session;Repositioned    Home Living                      Prior Function            PT Goals (current goals can now be found in the care plan section) Acute Rehab PT Goals Patient Stated Goal: Return home PT Goal Formulation: With patient Time For Goal Achievement: 11/23/20 Potential to Achieve Goals: Good Progress towards PT goals: Progressing toward goals    Frequency    Min 5X/week      PT Plan Current plan remains appropriate    Co-evaluation              AM-PAC PT "6 Clicks" Mobility   Outcome Measure  Help needed turning from your back to your side while in a flat bed without using bedrails?: None Help needed moving from lying on your back to sitting on the side of a flat bed without using bedrails?: None Help needed moving to and from a bed to a chair (including a wheelchair)?: A Little Help needed standing up from a chair using your arms (e.g., wheelchair or bedside  chair)?: A Little Help needed to walk in hospital room?: A Little Help needed climbing 3-5 steps with a railing? : A Little 6 Click Score: 20    End of Session Equipment Utilized During Treatment: Back brace Activity Tolerance: Patient tolerated treatment well Patient left: in bed;with call bell/phone within reach   PT Visit Diagnosis: Unsteadiness on feet (R26.81);Muscle weakness (generalized) (M62.81);Other abnormalities of gait and mobility (R26.89);Pain Pain - Right/Left:  (back) Pain - part of body:  (back)     Time: 9767-3419 PT Time Calculation (min) (ACUTE ONLY): 38 min  Charges:  $Gait Training: 8-22 mins $Therapeutic Activity: 23-37 mins                     Moishe Spice, PT, DPT Acute Rehabilitation Services  Pager: [phone] Office: [phone]    Orvan Falconer 11/17/2020, 9:32 AM

## 2020-11-17 NOTE — Anesthesia Postprocedure Evaluation (Signed)
Anesthesia Post Note  Patient: Anthony Nash [employer]) Performed: EVACUATION OF LUMBAR HEMATOMA (N/A Spine Lumbar) REVISION L3 DECOMPRESSION L4 (N/A )     Patient location during evaluation: PACU Anesthesia Type: General Level of consciousness: awake and alert Pain management: pain level controlled Vital Signs Assessment: post-procedure vital signs reviewed and stable Respiratory status: spontaneous breathing, nonlabored ventilation, respiratory function stable and patient connected to nasal cannula oxygen Cardiovascular status: blood pressure returned to baseline and stable Postop Assessment: no apparent nausea or vomiting Anesthetic complications: no   No complications documented.  Last Vitals:  Vitals:   11/17/20 0053 11/17/20 0543  BP: 111/65 121/71  Pulse: 74 74  Resp: 18 18  Temp: 37.4 C 36.8 C  SpO2: 100% 100%    Last Pain:  Vitals:   11/17/20 0636  TempSrc:   PainSc: 3                  Tiajuana Amass

## 2020-11-18 ENCOUNTER — Encounter (HOSPITAL_COMMUNITY): Payer: Self-pay | Admitting: Orthopedic Surgery

## 2020-11-18 LAB — POCT I-STAT, CHEM 8
BUN: 18 mg/dL (ref 8–23)
Calcium, Ion: 1.08 mmol/L — ABNORMAL LOW (ref 1.15–1.40)
Chloride: 101 mmol/L (ref 98–111)
Creatinine, Ser: 0.8 mg/dL (ref 0.61–1.24)
Glucose, Bld: 139 mg/dL — ABNORMAL HIGH (ref 70–99)
HCT: 41 % (ref 39.0–52.0)
Hemoglobin: 13.9 g/dL (ref 13.0–17.0)
Potassium: 7.4 mmol/L (ref 3.5–5.1)
Sodium: 132 mmol/L — ABNORMAL LOW (ref 135–145)
TCO2: 27 mmol/L (ref 22–32)

## 2020-11-27 NOTE — Discharge Summary (Signed)
Patient ID: Anthony Nash MRN: [id number] DOB/AGE: 68-Sep-1952 68 y.o.  Admit date: 11/15/2020 Discharge date: 11/27/2020  Admission Diagnoses:  Active Problems:   Spinal stenosis of lumbar region   Spinal stenosis   Discharge Diagnoses:  Active Problems:   Spinal stenosis of lumbar region   Spinal stenosis  status post Procedure(s): EVACUATION OF LUMBAR HEMATOMA REVISION L3 DECOMPRESSION L4  Past Medical History:  Diagnosis Date  . Arthritis   . Diabetes mellitus without complication ([place])   . GERD (gastroesophageal reflux disease)   . Inguinal hernia   . Neuropathy    per patient in the right foot  . Prostate cancer ([place]) 2018  . Sleep apnea    does not wear CPAP     Surgeries: Procedure(s): EVACUATION OF LUMBAR HEMATOMA REVISION L3 DECOMPRESSION L4 on 11/15/2020   Consultants:   Discharged Condition: Improved  Hospital Course: Anthony Nash is an 70 y.o. male who was admitted 11/15/2020 for operative treatment ofEVACUATION OF LUMBAR HEMATOMA REVISION L3 DECOMPRESSION L4. Patient failed conservative treatments (please see the history and physical for the specifics) and had severe unremitting pain that affects sleep, daily activities and work/hobbies. After pre-op clearance, the patient was taken to the operating room on 11/15/2020 and underwent  Procedure(s): EVACUATION OF LUMBAR HEMATOMA REVISION L3 DECOMPRESSION L4.    Patient was given perioperative antibiotics:  Anti-infectives (From admission, onward)   Start     Dose/Rate Route Frequency Ordered Stop   11/16/20 1100  ceFAZolin (ANCEF) IVPB 1 g/50 mL premix  Status:  Discontinued        1 g 100 mL/hr over 30 Minutes Intravenous Every 8 hours 11/16/20 0843 11/16/20 1925   11/16/20 0300  ceFAZolin (ANCEF) IVPB 1 g/50 mL premix  Status:  Discontinued        1 g 100 mL/hr over 30 Minutes Intravenous Every 8 hours 11/15/20 2338 11/16/20 0843   11/15/20 1918  ceFAZolin (ANCEF) 2-4 GM/100ML-% IVPB       Note to  Pharmacy: Ladoris Gene   : cabinet override      11/15/20 1918 11/16/20 0729       Patient was given sequential compression devices and early ambulation to prevent DVT.   Patient benefited maximally from hospital stay and there were no complications. At the time of discharge, the patient was urinating/moving their bowels without difficulty, tolerating a regular diet, pain is controlled with oral pain medications and they have been cleared by PT/OT.   Recent vital signs: No data found.   Recent laboratory studies: No results for input(s): WBC, HGB, HCT, PLT, NA, K, CL, CO2, BUN, CREATININE, GLUCOSE, INR, CALCIUM in the last 72 hours.  Invalid input(s): PT, 2   Discharge Medications:   Allergies as of 11/17/2020   No Known Allergies     Medication List    TAKE these medications   bismuth subsalicylate 025 KY/70WC suspension Commonly known as: PEPTO BISMOL Take 30 mLs by mouth every 6 (six) hours as needed for indigestion or diarrhea or loose stools.   LUBRICATING EYE DROPS OP Place 2-3 drops into both eyes daily as needed (dry eyes).   metFORMIN 500 MG tablet Commonly known as: GLUCOPHAGE Take 500 mg by mouth 2 (two) times daily.   omeprazole 40 MG capsule Commonly known as: PRILOSEC Take 40 mg by mouth at bedtime.   ondansetron 4 MG tablet Commonly known as: Zofran Take 1 tablet (4 mg total) by mouth every 8 (eight) hours as needed for nausea or  vomiting.     ASK your doctor about these medications   methocarbamol 500 MG tablet Commonly known as: Robaxin Take 1 tablet (500 mg total) by mouth every 8 (eight) hours as needed for up to 5 days for muscle spasms. Ask about: Should I take this medication?   oxyCODONE-acetaminophen 10-325 MG tablet Commonly known as: Percocet Take 1 tablet by mouth every 6 (six) hours as needed for up to 5 days for pain. Ask about: Should I take this medication?       Diagnostic Studies: DG Chest 2 View  Result Date:  11/12/2020 CLINICAL DATA:  Preop lumbar spine surgery ex smoker EXAM: CHEST - 2 VIEW COMPARISON:  05/31/2018 FINDINGS: Surgical hardware in the cervical spine. No focal opacity or pleural effusion. Normal cardiomediastinal silhouette. No pneumothorax. IMPRESSION: No active cardiopulmonary disease. Electronically Signed   By: Donavan Foil M.D.   On: 11/12/2020 21:48   DG Lumbar Spine 2-3 Views  Result Date: 11/14/2020 CLINICAL DATA:  Lumbar decompression EXAM: LUMBAR SPINE - 2-3 VIEW COMPARISON:  September 26, 2018 FINDINGS: Lateral lumbar image labeled image 1 demonstrates metallic probe tips posterior to the inferior aspect of L4 and L5-S1 levels. No fracture or spondylolisthesis. Lateral lumbar image labeled image 2 shows a probe posterior to the L4-5 interspace. No fracture or spondylolisthesis. Disc spaces appear unremarkable. IMPRESSION: On final submitted lateral image, metallic probe tip is posterior to the L4-5 level. These results were called by telephone at the time of interpretation on 11/14/2020 at 8:37 am to provider [facility] , who verbally acknowledged these results. Electronically Signed   By: Lowella Grip III M.D.   On: 11/14/2020 08:37   DG Lumbar Spine 1 View  Result Date: 11/15/2020 CLINICAL DATA:  L3-4 decompression, intraoperative examination EXAM: LUMBAR SPINE - 1 VIEW COMPARISON:  11/14/2020 FINDINGS: Portable, cross-table lateral intraoperative radiograph of the lumbar spine demonstrates the lumbar spine from L2-S1. Soft tissue retractors seen within the soft tissues posterior to the L3-4 facet joint. A a metallic probe is seen overlying the L3 pedicle. Minimal intervertebral disc space narrowing at L4-5 in keeping with changes of minimal degenerative disc disease. Minimal endplate remodeling noted at L2-L5. No listhesis. IMPRESSION: Metallic probe overlying the L3 pedicle. Electronically Signed   By: Fidela Salisbury MD   On: 11/15/2020 22:49    Discharge Instructions     Incentive spirometry RT   Complete by: As directed        Follow-up Information    Melina Schools, MD. Schedule an appointment as soon as possible for a visit in 2 week(s).   Specialty: Orthopedic Surgery Contact information: [address] [place] [address] [phone]               Discharge Plan:  discharge to home  Disposition: stable    Signed: Yvonne Kendall Hazelynn Mckenny for [facility] PA-C [facility] [phone] 11/27/2020, 8:59 AM

## 2022-03-07 IMAGING — CR DG LUMBAR SPINE 2-3V
2 series · 2 of 2 positions shown · non-contrast
Comparison: September 26, 2018

CLINICAL DATA: Lumbar decompression

EXAM:
LUMBAR SPINE - 2-3 VIEW

[lateral (1 of 2)]
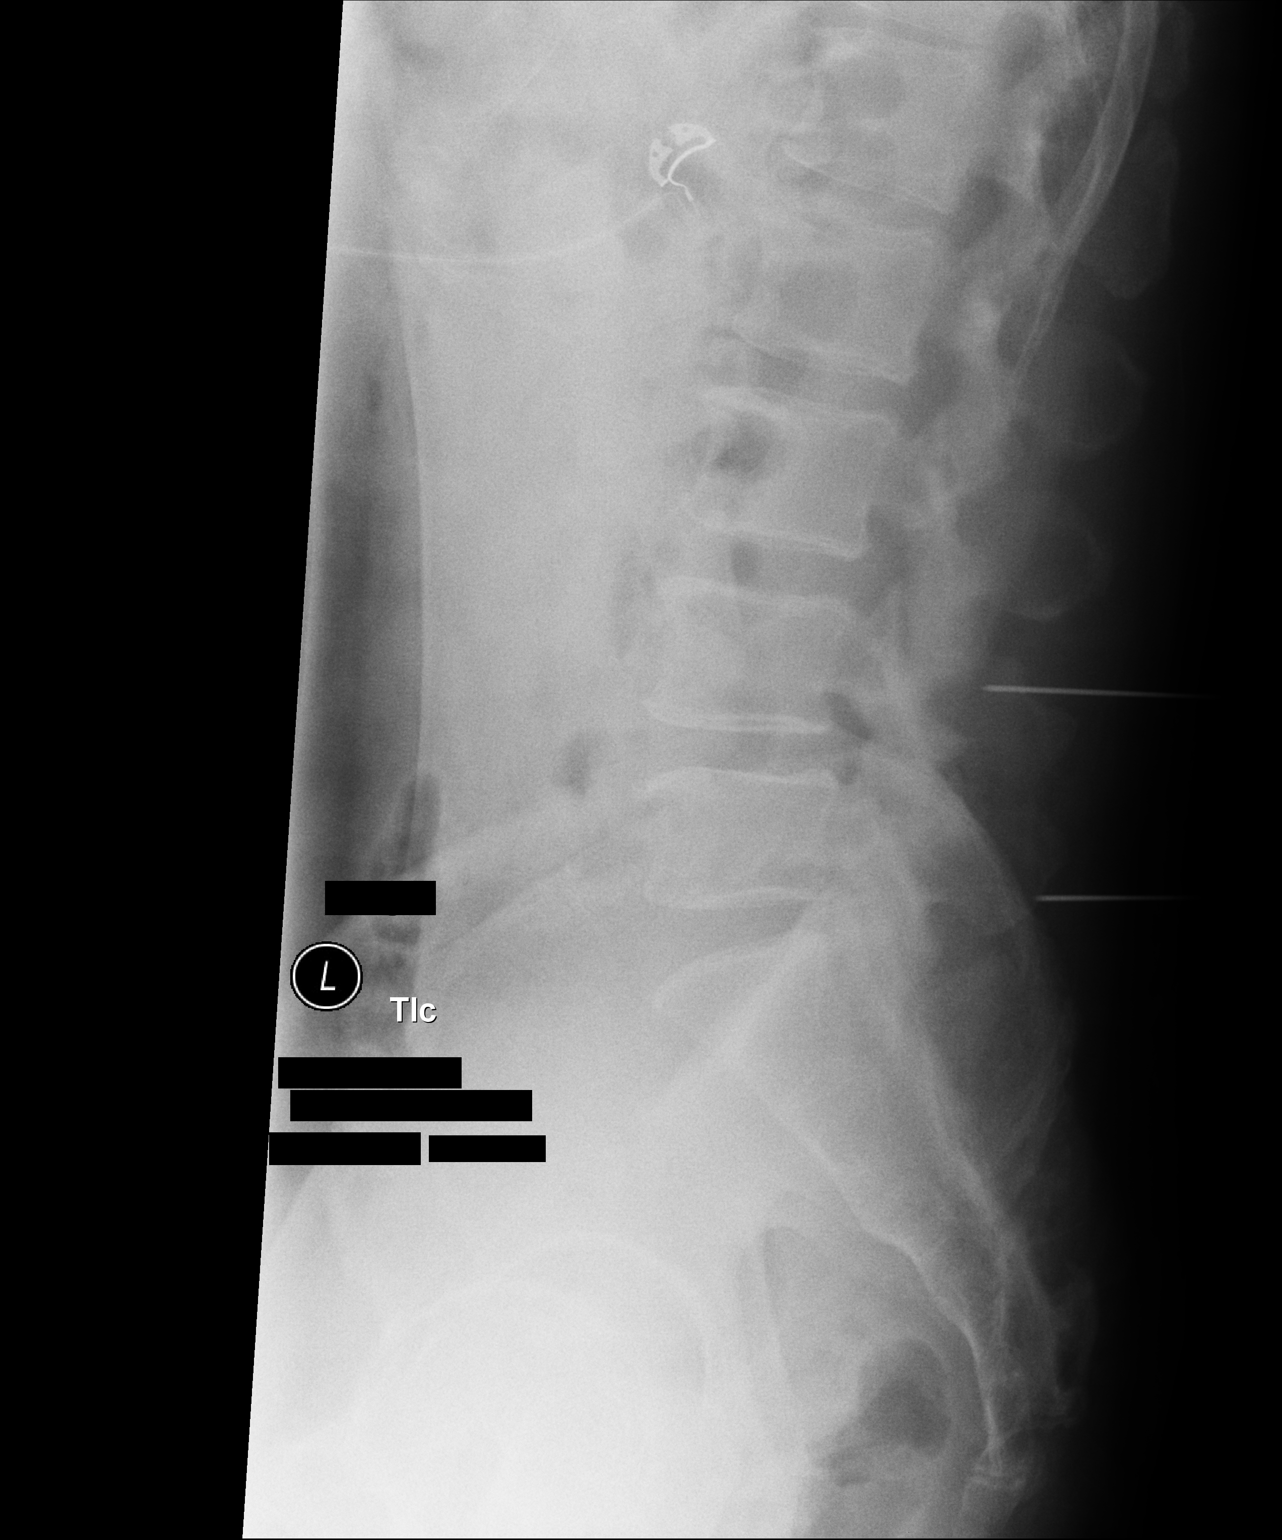

[lateral (2 of 2)]
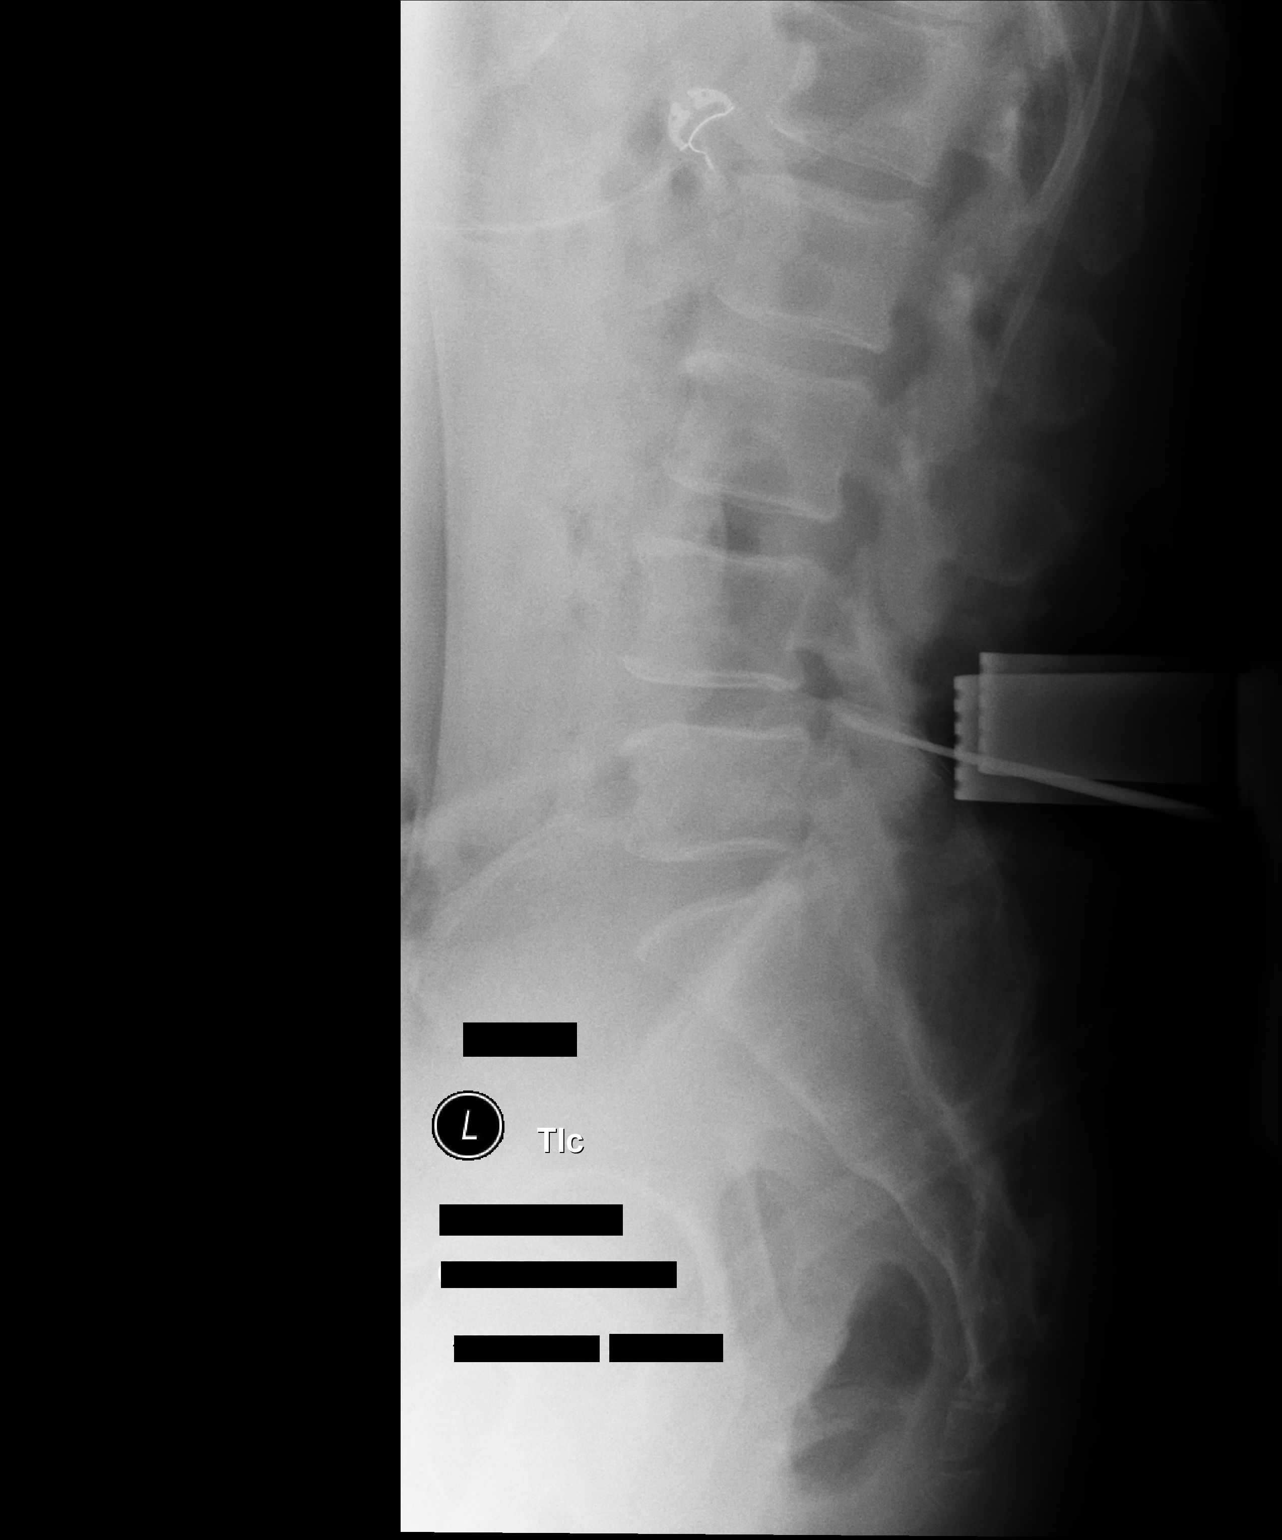

[2 of 2 positions shown; findings below may reference images not displayed]

FINDINGS: Lateral lumbar image labeled image 1 demonstrates metallic probe
tips posterior to the inferior aspect of L4 and L5-S1 levels. No
fracture or spondylolisthesis.

Lateral lumbar image labeled image 2 shows a probe posterior to the
L4-5 interspace. No fracture or spondylolisthesis. Disc spaces
appear unremarkable.
IMPRESSION: On final submitted lateral image, metallic probe tip is posterior to
the L4-5 level.

These results were called by telephone at the time of interpretation
on 11/14/2020 at [DATE] to provider NOGOMETNA LORDAN , who verbally
acknowledged these results.
